# Patient Record
Sex: Female | Born: 1975 | Race: Black or African American | Hispanic: No | Marital: Single | Smoking: Never smoker
Health system: Southern US, Community
[De-identification: ages and names within clinical notes are randomized; demographics above are authoritative.]

## PROBLEM LIST (undated history)

## (undated) DIAGNOSIS — I10 Essential (primary) hypertension: Secondary | ICD-10-CM

## (undated) DIAGNOSIS — R51 Headache: Secondary | ICD-10-CM

## (undated) DIAGNOSIS — T7840XA Allergy, unspecified, initial encounter: Secondary | ICD-10-CM

## (undated) DIAGNOSIS — L309 Dermatitis, unspecified: Secondary | ICD-10-CM

## (undated) DIAGNOSIS — F419 Anxiety disorder, unspecified: Secondary | ICD-10-CM

## (undated) DIAGNOSIS — H539 Unspecified visual disturbance: Secondary | ICD-10-CM

## (undated) DIAGNOSIS — G8929 Other chronic pain: Secondary | ICD-10-CM

## (undated) DIAGNOSIS — D649 Anemia, unspecified: Secondary | ICD-10-CM

## (undated) DIAGNOSIS — M549 Dorsalgia, unspecified: Secondary | ICD-10-CM

## (undated) HISTORY — DX: Essential (primary) hypertension: I10

## (undated) HISTORY — DX: Anxiety disorder, unspecified: F41.9

## (undated) HISTORY — DX: Dorsalgia, unspecified: M54.9

## (undated) HISTORY — DX: Other chronic pain: G89.29

## (undated) HISTORY — DX: Dermatitis, unspecified: L30.9

## (undated) HISTORY — DX: Headache: R51

## (undated) HISTORY — DX: Unspecified visual disturbance: H53.9

## (undated) HISTORY — DX: Allergy, unspecified, initial encounter: T78.40XA

## (undated) HISTORY — DX: Anemia, unspecified: D64.9

---

## 2011-04-06 HISTORY — PX: CERVICAL POLYPECTOMY: SHX88

## 2011-04-06 HISTORY — PX: DILATION AND CURETTAGE OF UTERUS: SHX78

## 2011-12-31 ENCOUNTER — Ambulatory Visit (INDEPENDENT_AMBULATORY_CARE_PROVIDER_SITE_OTHER): Payer: BC Managed Care – PPO | Admitting: Family Medicine

## 2011-12-31 VITALS — BP 130/85 | HR 83 | Temp 98.4°F | Resp 18 | Ht 69.0 in | Wt 235.0 lb

## 2011-12-31 DIAGNOSIS — F419 Anxiety disorder, unspecified: Secondary | ICD-10-CM

## 2011-12-31 DIAGNOSIS — R109 Unspecified abdominal pain: Secondary | ICD-10-CM

## 2011-12-31 DIAGNOSIS — M7650 Patellar tendinitis, unspecified knee: Secondary | ICD-10-CM

## 2011-12-31 DIAGNOSIS — K5900 Constipation, unspecified: Secondary | ICD-10-CM

## 2011-12-31 DIAGNOSIS — L980 Pyogenic granuloma: Secondary | ICD-10-CM

## 2011-12-31 DIAGNOSIS — L02419 Cutaneous abscess of limb, unspecified: Secondary | ICD-10-CM

## 2011-12-31 DIAGNOSIS — IMO0002 Reserved for concepts with insufficient information to code with codable children: Secondary | ICD-10-CM

## 2011-12-31 DIAGNOSIS — F411 Generalized anxiety disorder: Secondary | ICD-10-CM

## 2011-12-31 DIAGNOSIS — L309 Dermatitis, unspecified: Secondary | ICD-10-CM

## 2011-12-31 DIAGNOSIS — L259 Unspecified contact dermatitis, unspecified cause: Secondary | ICD-10-CM

## 2011-12-31 LAB — POCT URINALYSIS DIPSTICK
Glucose, UA: NEGATIVE
Leukocytes, UA: NEGATIVE
Nitrite, UA: NEGATIVE
Protein, UA: NEGATIVE
Spec Grav, UA: 1.025
Urobilinogen, UA: 0.2

## 2011-12-31 LAB — POCT UA - MICROSCOPIC ONLY
Casts, Ur, LPF, POC: NEGATIVE
Crystals, Ur, HPF, POC: NEGATIVE

## 2011-12-31 MED ORDER — CLOBETASOL PROPIONATE 0.05 % EX OINT: TOPICAL_OINTMENT | Freq: Two times a day (BID) | CUTANEOUS | Status: DC

## 2011-12-31 MED ORDER — DOXYCYCLINE HYCLATE 100 MG PO TABS: 100.0000 mg | ORAL_TABLET | Freq: Two times a day (BID) | ORAL | Status: DC

## 2011-12-31 NOTE — Patient Instructions (Addendum)
Take miralax daily for bowels When loose switch to taking metamucil daily Drink lots of fluids. Fruits and vegetables  Clobetasol as needed   Doxycycline twice daily for the infection

## 2011-12-31 NOTE — Progress Notes (Signed)
Subjective: Rolling bands of epigastric discomfort and nausea.  No food intolerances.  Past few weeks especially. Heartburn med did not help in the past.Some constipation.  Hx boils under arm.  Ruptured out now.  Sharp pains in arm on Tuesday.  Stressed.    Burning sensation left knee.    Eczema.  Wants more clobetasol.  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  Chest clear.  Heart rrr.  Abd.  bs normal.  Soft.  Left mild tenderness and fullness. Knee normal.  Patellar tendon area pains.  Pyogenic granuloma over old abscess of left axilla Spent 45 min examining and talking (pt refused blood drawing and staff spent an additional 15 min with her)   Assessment Axillary abscess and pyogenic granuloma Abdominal pain Eczema Knee pain, patellar tendon Abdominal pain Anxiety   Results for orders placed in visit on 12/31/11  POCT UA - MICROSCOPIC ONLY      Component Value Range   WBC, Ur, HPF, POC 0-3     RBC, urine, microscopic tntc     Bacteria, U Microscopic trace     Mucus, UA pos     Epithelial cells, urine per micros 1-2     Crystals, Ur, HPF, POC neg     Casts, Ur, LPF, POC neg     Yeast, UA neg    POCT URINALYSIS DIPSTICK      Component Value Range   Color, UA yellow     Clarity, UA clear     Glucose, UA neg     Bilirubin, UA neg     Ketones, UA neg     Spec Grav, UA 1.025     Blood, UA large     pH, UA 5.5     Protein, UA neg     Urobilinogen, UA 0.2     Nitrite, UA neg     Leukocytes, UA Negative     Patient refused labs  See discharge instructions  Return 6 weeks

## 2012-01-04 LAB — WOUND CULTURE
Gram Stain: NONE SEEN
Gram Stain: NONE SEEN

## 2012-01-21 ENCOUNTER — Ambulatory Visit (INDEPENDENT_AMBULATORY_CARE_PROVIDER_SITE_OTHER): Payer: BC Managed Care – PPO | Admitting: Family Medicine

## 2012-01-21 VITALS — BP 124/89 | HR 88 | Temp 98.7°F | Resp 18 | Wt 235.0 lb

## 2012-01-21 DIAGNOSIS — R110 Nausea: Secondary | ICD-10-CM

## 2012-01-21 DIAGNOSIS — M25519 Pain in unspecified shoulder: Secondary | ICD-10-CM

## 2012-01-21 DIAGNOSIS — R1012 Left upper quadrant pain: Secondary | ICD-10-CM

## 2012-01-21 DIAGNOSIS — IMO0002 Reserved for concepts with insufficient information to code with codable children: Secondary | ICD-10-CM

## 2012-01-21 DIAGNOSIS — M25512 Pain in left shoulder: Secondary | ICD-10-CM

## 2012-01-21 DIAGNOSIS — S46912A Strain of unspecified muscle, fascia and tendon at shoulder and upper arm level, left arm, initial encounter: Secondary | ICD-10-CM

## 2012-01-21 MED ORDER — MELOXICAM 15 MG PO TABS: 15.0000 mg | ORAL_TABLET | Freq: Every day | ORAL | Status: DC

## 2012-01-21 MED ORDER — LANSOPRAZOLE 30 MG PO CPDR: 30.0000 mg | DELAYED_RELEASE_CAPSULE | Freq: Every day | ORAL | Status: DC

## 2012-01-21 NOTE — Progress Notes (Signed)
[address]   [place], [place]  [address]   [phone]  Subjective:    Patient ID: Amanda Haley, female    DOB: [birth year], 36 y.o.   MRN: [id number]  HPIThis 36 y.o. female presents for evaluation of shoulder pain L.  Onset one week ago.  Starts in L shoulder and radiates into L arm to elbow.  No pain in R arm.  A lot of issues with L side of body.  No excessive lifting; help mom set up yard sale two weeks ago; +associated neck pain.  S/p 2 EKGs in past; presented to Urgent Care in past; diagnosed with costochondritis; had lifted something heavy.  No chest pain with this shoulder pain. Worried about MS.    2. Gets burning across abdomen; Dr. Alwyn Ren prescribed Miralax and fiber without much improvement; hears stomach rumbling a lot.  Gets waves of nausea.  Not exercising; not eating right.  Intermittent burning.  Rash develps along L side of abdomen.  Feels warmer on L side.  Bumps come up when feels burning.  NO heartburn; no indigestion.  [place] fries help for a while.  Eating helps with nausea sometimes.  Four years ago, started going to doctors.  History of severe anemia; in June 2013, had surgery for gynecological for uterine polyps.  At last visit, normal bowel movements daily to once weekly.  Stools vary.  No consistent pattern.  Has been taking Miralax without much improvement.  Was having burning in abdomen daily; now having less frequent.  No hematuria; no dysuria. Regular menses; not pregnant.  LMP 2 weeks ago.  3.  Low back pain: s/p PT five years ago; dull pain at different areas of back.  No radiation into legs.  No leg weakness.  No n/t/w.  No saddle paresthesias.  No b/b dysfunction.  4.  L knee pain: intermittent pain.  No associated trauma or overuse.  No swelling.  No giving out.  No popping.     Review of Systems  Constitutional: Negative for fever, chills, diaphoresis and fatigue.  Gastrointestinal: Positive for nausea, abdominal pain and constipation. Negative for vomiting,  diarrhea, blood in stool, abdominal distention, anal bleeding and rectal pain.  Genitourinary: Negative for dysuria, urgency, frequency, hematuria, flank pain, vaginal bleeding, vaginal discharge and vaginal pain.  Musculoskeletal: Positive for back pain and arthralgias. Negative for joint swelling and gait problem.  Skin: Positive for rash.  Neurological: Negative for weakness and numbness.    No past medical history on file.  No past surgical history on file.  Prior to Admission medications   Medication Sig Start Date End Date Taking? Authorizing Provider  clobetasol ointment (TEMOVATE) 0.05 % Apply topically 2 (two) times daily. 12/31/11  Yes Peyton Najjar, MD  doxycycline (VIBRA-TABS) 100 MG tablet Take 1 tablet (100 mg total) by mouth 2 (two) times daily. 12/31/11   Peyton Najjar, MD  lansoprazole (PREVACID) 30 MG capsule Take 1 capsule (30 mg total) by mouth daily. 01/21/12   Ethelda Chick, MD  meloxicam (MOBIC) 15 MG tablet Take 1 tablet (15 mg total) by mouth daily. 01/21/12   Ethelda Chick, MD    No Known Allergies  History   Social History  . Marital Status: Single    Spouse Name: N/A    Number of Children: N/A  . Years of Education: N/A   Occupational History  . Not on file.   Social History Main Topics  . Smoking status: Never Smoker   .  Smokeless tobacco: Not on file  . Alcohol Use: Not on file  . Drug Use: Not on file  . Sexually Active: Not on file   Other Topics Concern  . Not on file   Social History Narrative  . No narrative on file    No family history on file.     Objective:   Physical Exam  Nursing note and vitals reviewed. Constitutional: She is oriented to person, place, and time. She appears well-developed and well-nourished. No distress.  HENT:  Mouth/Throat: Oropharynx is clear and moist.  Eyes: Conjunctivae normal and EOM are normal. Pupils are equal, round, and reactive to light.  Neck: Normal range of motion. Neck supple. No  thyromegaly present.  Cardiovascular: Normal rate, regular rhythm and normal heart sounds.   Pulmonary/Chest: Effort normal and breath sounds normal.  Abdominal: Soft. Bowel sounds are normal. She exhibits no distension and no mass. There is tenderness in the left upper quadrant. There is no rebound and no guarding.  Musculoskeletal:       Left shoulder: She exhibits normal range of motion, no tenderness, no bony tenderness, no spasm and normal strength.       Left elbow: She exhibits normal range of motion and no swelling. no tenderness found. No radial head, no medial epicondyle, no lateral epicondyle and no olecranon process tenderness noted.       Left knee: She exhibits normal range of motion, no swelling, no effusion, no ecchymosis, no deformity, no erythema and no bony tenderness. No medial joint line, no lateral joint line and no patellar tendon tenderness noted.       Cervical back: She exhibits normal range of motion, no tenderness and no bony tenderness.       Lumbar back: She exhibits normal range of motion, no tenderness, no bony tenderness, no pain, no spasm and normal pulse.  Lymphadenopathy:    She has no cervical adenopathy.  Neurological: She is alert and oriented to person, place, and time.  Skin: No rash noted. She is not diaphoretic.  Psychiatric: She has a normal mood and affect. Her behavior is normal. Judgment and thought content normal.       Assessment & Plan:   1. Shoulder pain, left    2. Strain of shoulder, left  meloxicam (MOBIC) 15 MG tablet  3. Abdominal pain, LUQ  lansoprazole (PREVACID) 30 MG capsule  4. Nausea  lansoprazole (PREVACID) 30 MG capsule     1. L Shoulder Pain/Strain:  New.  Recommend rest, ice, NSAIDs.  Rx for Mobic 15mg  daily provided.  Home exercises reviewed and advised to perform daily for next 2-4 weeks. If no improvement in 2-4 weeks, to call for ortho referral.  Avoid repetitive use of L shoulder for next 2-3 weeks.  If no improvement  in pain, will warrant xray. 2.  Abdominal pain LUQ:  New.  Ddx includes GERD, gastritis, constipation.  Associated constipation and nausea. Continue Miralax daily.  Add Prevacid 30mg  daily.  If no improvement in 1 month, RTC for further evaluation. 3.  Nausea:  New.  Associated with LUQ pain, constipation.  No associated vomiting or diarrhea.  Treat with Prevacid, BRAT diet.  If no improvement in one month, RTC.

## 2012-01-21 NOTE — Patient Instructions (Addendum)
1. Shoulder pain, left    2. Strain of shoulder, left  meloxicam (MOBIC) 15 MG tablet  3. Abdominal pain, LUQ  lansoprazole (PREVACID) 30 MG capsule  4. Nausea  lansoprazole (PREVACID) 30 MG capsule   Impingement Syndrome, Rotator Cuff, Bursitis with Rehab Impingement syndrome is a condition that involves inflammation of the tendons of the rotator cuff and the subacromial bursa, that causes pain in the shoulder. The rotator cuff consists of four tendons and muscles that control much of the shoulder and upper arm function. The subacromial bursa is a fluid filled sac that helps reduce friction between the rotator cuff and one of the bones of the shoulder (acromion). Impingement syndrome is usually an overuse injury that causes swelling of the bursa (bursitis), swelling of the tendon (tendonitis), and/or a tear of the tendon (strain). Strains are classified into three categories. Grade 1 strains cause pain, but the tendon is not lengthened. Grade 2 strains include a lengthened ligament, due to the ligament being stretched or partially ruptured. With grade 2 strains there is still function, although the function may be decreased. Grade 3 strains include a complete tear of the tendon or muscle, and function is usually impaired. SYMPTOMS   Pain around the shoulder, often at the outer portion of the upper arm.  Pain that gets worse with shoulder function, especially when reaching overhead or lifting.  Sometimes, aching when not using the arm.  Pain that wakes you up at night.  Sometimes, tenderness, swelling, warmth, or redness over the affected area.  Loss of strength.  Limited motion of the shoulder, especially reaching behind the back (to the back pocket or to unhook bra) or across your body.  Crackling sound (crepitation) when moving the arm.  Biceps tendon pain and inflammation (in the front of the shoulder). Worse when bending the elbow or lifting. CAUSES  Impingement syndrome is often an  overuse injury, in which chronic (repetitive) motions cause the tendons or bursa to become inflamed. A strain occurs when a force is paced on the tendon or muscle that is greater than it can withstand. Common mechanisms of injury include: Stress from sudden increase in duration, frequency, or intensity of training.  Direct hit (trauma) to the shoulder.  Aging, erosion of the tendon with normal use.  Bony bump on shoulder (acromial spur). RISK INCREASES WITH:  Contact sports (football, wrestling, boxing).  Throwing sports (baseball, tennis, volleyball).  Weightlifting and bodybuilding.  Heavy labor.  Previous injury to the rotator cuff, including impingement.  Poor shoulder strength and flexibility.  Failure to warm up properly before activity.  Inadequate protective equipment.  Old age.  Bony bump on shoulder (acromial spur). PREVENTION   Warm up and stretch properly before activity.  Allow for adequate recovery between workouts.  Maintain physical fitness:  Strength, flexibility, and endurance.  Cardiovascular fitness.  Learn and use proper exercise technique. PROGNOSIS  If treated properly, impingement syndrome usually goes away within 6 weeks. Sometimes surgery is required.  RELATED COMPLICATIONS   Longer healing time if not properly treated, or if not given enough time to heal.  Recurring symptoms, that result in a chronic condition.  Shoulder stiffness, frozen shoulder, or loss of motion.  Rotator cuff tendon tear.  Recurring symptoms, especially if activity is resumed too soon, with overuse, with a direct blow, or when using poor technique. TREATMENT  Treatment first involves the use of ice and medicine, to reduce pain and inflammation. The use of strengthening and stretching exercises may help  reduce pain with activity. These exercises may be performed at home or with a therapist. If non-surgical treatment is unsuccessful after more than 6 months,  surgery may be advised. After surgery and rehabilitation, activity is usually possible in 3 months.  MEDICATION  If pain medicine is needed, nonsteroidal anti-inflammatory medicines (aspirin and ibuprofen), or other minor pain relievers (acetaminophen), are often advised.  Do not take pain medicine for 7 days before surgery.  Prescription pain relievers may be given, if your caregiver thinks they are needed. Use only as directed and only as much as you need.  Corticosteroid injections may be given by your caregiver. These injections should be reserved for the most serious cases, because they may only be given a certain number of times. HEAT AND COLD  Cold treatment (icing) should be applied for 10 to 15 minutes every 2 to 3 hours for inflammation and pain, and immediately after activity that aggravates your symptoms. Use ice packs or an ice massage.  Heat treatment may be used before performing stretching and strengthening activities prescribed by your caregiver, physical therapist, or athletic trainer. Use a heat pack or a warm water soak. SEEK MEDICAL CARE IF:   Symptoms get worse or do not improve in 4 to 6 weeks, despite treatment.  New, unexplained symptoms develop. (Drugs used in treatment may produce side effects.) EXERCISES  RANGE OF MOTION (ROM) AND STRETCHING EXERCISES - Impingement Syndrome (Rotator Cuff  Tendinitis, Bursitis) These exercises may help you when beginning to rehabilitate your injury. Your symptoms may go away with or without further involvement from your physician, physical therapist or athletic trainer. While completing these exercises, remember:   Restoring tissue flexibility helps normal motion to return to the joints. This allows healthier, less painful movement and activity.  An effective stretch should be held for at least 30 seconds.  A stretch should never be painful. You should only feel a gentle lengthening or release in the stretched tissue. STRETCH   Flexion, Standing  Stand with good posture. With an underhand grip on your right / left hand, and an overhand grip on the opposite hand, grasp a broomstick or cane so that your hands are a little more than shoulder width apart.  Keeping your right / left elbow straight and shoulder muscles relaxed, push the stick with your opposite hand, to raise your right / left arm in front of your body and then overhead. Raise your arm until you feel a stretch in your right / left shoulder, but before you have increased shoulder pain.  Try to avoid shrugging your right / left shoulder as your arm rises, by keeping your shoulder blade tucked down and toward your mid-back spine. Hold for __________ seconds.  Slowly return to the starting position. Repeat __________ times. Complete this exercise __________ times per day. STRETCH  Abduction, Supine  Lie on your back. With an underhand grip on your right / left hand and an overhand grip on the opposite hand, grasp a broomstick or cane so that your hands are a little more than shoulder width apart.  Keeping your right / left elbow straight and your shoulder muscles relaxed, push the stick with your opposite hand, to raise your right / left arm out to the side of your body and then overhead. Raise your arm until you feel a stretch in your right / left shoulder, but before you have increased shoulder pain.  Try to avoid shrugging your right / left shoulder as your arm rises, by  keeping your shoulder blade tucked down and toward your mid-back spine. Hold for __________ seconds.  Slowly return to the starting position. Repeat __________ times. Complete this exercise __________ times per day. ROM  Flexion, Active-Assisted  Lie on your back. You may bend your knees for comfort.  Grasp a broomstick or cane so your hands are about shoulder width apart. Your right / left hand should grip the end of the stick, so that your hand is positioned "thumbs-up," as if you were  about to shake hands.  Using your healthy arm to lead, raise your right / left arm overhead, until you feel a gentle stretch in your shoulder. Hold for __________ seconds.  Use the stick to assist in returning your right / left arm to its starting position. Repeat __________ times. Complete this exercise __________ times per day.  ROM - Internal Rotation, Supine   Lie on your back on a firm surface. Place your right / left elbow about 60 degrees away from your side. Elevate your elbow with a folded towel, so that the elbow and shoulder are the same height.  Using a broomstick or cane and your strong arm, pull your right / left hand toward your body until you feel a gentle stretch, but no increase in your shoulder pain. Keep your shoulder and elbow in place throughout the exercise.  Hold for __________ seconds. Slowly return to the starting position. Repeat __________ times. Complete this exercise __________ times per day. STRETCH - Internal Rotation  Place your right / left hand behind your back, palm up.  Throw a towel or belt over your opposite shoulder. Grasp the towel with your right / left hand.  While keeping an upright posture, gently pull up on the towel, until you feel a stretch in the front of your right / left shoulder.  Avoid shrugging your right / left shoulder as your arm rises, by keeping your shoulder blade tucked down and toward your mid-back spine.  Hold for __________ seconds. Release the stretch, by lowering your healthy hand. Repeat __________ times. Complete this exercise __________ times per day. ROM - Internal Rotation   Using an underhand grip, grasp a stick behind your back with both hands.  While standing upright with good posture, slide the stick up your back until you feel a mild stretch in the front of your shoulder.  Hold for __________ seconds. Slowly return to your starting position. Repeat __________ times. Complete this exercise __________ times per  day.  STRETCH  Posterior Shoulder Capsule   Stand or sit with good posture. Grasp your right / left elbow and draw it across your chest, keeping it at the same height as your shoulder.  Pull your elbow, so your upper arm comes in closer to your chest. Pull until you feel a gentle stretch in the back of your shoulder.  Hold for __________ seconds. Repeat __________ times. Complete this exercise __________ times per day. STRENGTHENING EXERCISES - Impingement Syndrome (Rotator Cuff Tendinitis, Bursitis) These exercises may help you when beginning to rehabilitate your injury. They may resolve your symptoms with or without further involvement from your physician, physical therapist or athletic trainer. While completing these exercises, remember:  Muscles can gain both the endurance and the strength needed for everyday activities through controlled exercises.  Complete these exercises as instructed by your physician, physical therapist or athletic trainer. Increase the resistance and repetitions only as guided.  You may experience muscle soreness or fatigue, but the pain or discomfort you  are trying to eliminate should never worsen during these exercises. If this pain does get worse, stop and make sure you are following the directions exactly. If the pain is still present after adjustments, discontinue the exercise until you can discuss the trouble with your clinician.  During your recovery, avoid activity or exercises which involve actions that place your injured hand or elbow above your head or behind your back or head. These positions stress the tissues which you are trying to heal. STRENGTH - Scapular Depression and Adduction   With good posture, sit on a firm chair. Support your arms in front of you, with pillows, arm rests, or on a table top. Have your elbows in line with the sides of your body.  Gently draw your shoulder blades down and toward your mid-back spine. Gradually increase the  tension, without tensing the muscles along the top of your shoulders and the back of your neck.  Hold for __________ seconds. Slowly release the tension and relax your muscles completely before starting the next repetition.  After you have practiced this exercise, remove the arm support and complete the exercise in standing as well as sitting position. Repeat __________ times. Complete this exercise __________ times per day.  STRENGTH - Shoulder Abductors, Isometric  With good posture, stand or sit about 4-6 inches from a wall, with your right / left side facing the wall.  Bend your right / left elbow. Gently press your right / left elbow into the wall. Increase the pressure gradually, until you are pressing as hard as you can, without shrugging your shoulder or increasing any shoulder discomfort.  Hold for __________ seconds.  Release the tension slowly. Relax your shoulder muscles completely before you begin the next repetition. Repeat __________ times. Complete this exercise __________ times per day.  STRENGTH - External Rotators, Isometric  Keep your right / left elbow at your side and bend it 90 degrees.  Step into a door frame so that the outside of your right / left wrist can press against the door frame without your upper arm leaving your side.  Gently press your right / left wrist into the door frame, as if you were trying to swing the back of your hand away from your stomach. Gradually increase the tension, until you are pressing as hard as you can, without shrugging your shoulder or increasing any shoulder discomfort.  Hold for __________ seconds.  Release the tension slowly. Relax your shoulder muscles completely before you begin the next repetition. Repeat __________ times. Complete this exercise __________ times per day.  STRENGTH - Supraspinatus   Stand or sit with good posture. Grasp a __________ weight, or an exercise band or tubing, so that your hand is "thumbs-up,"  like you are shaking hands.  Slowly lift your right / left arm in a "V" away from your thigh, diagonally into the space between your side and straight ahead. Lift your hand to shoulder height or as far as you can, without increasing any shoulder pain. At first, many people do not lift their hands above shoulder height.  Avoid shrugging your right / left shoulder as your arm rises, by keeping your shoulder blade tucked down and toward your mid-back spine.  Hold for __________ seconds. Control the descent of your hand, as you slowly return to your starting position. Repeat __________ times. Complete this exercise __________ times per day.  STRENGTH - External Rotators  Secure a rubber exercise band or tubing to a fixed object (table, pole)  so that it is at the same height as your right / left elbow when you are standing or sitting on a firm surface.  Stand or sit so that the secured exercise band is at your uninjured side.  Bend your right / left elbow 90 degrees. Place a folded towel or small pillow under your right / left arm, so that your elbow is a few inches away from your side.  Keeping the tension on the exercise band, pull it away from your body, as if pivoting on your elbow. Be sure to keep your body steady, so that the movement is coming only from your rotating shoulder.  Hold for __________ seconds. Release the tension in a controlled manner, as you return to the starting position. Repeat __________ times. Complete this exercise __________ times per day.  STRENGTH - Internal Rotators   Secure a rubber exercise band or tubing to a fixed object (table, pole) so that it is at the same height as your right / left elbow when you are standing or sitting on a firm surface.  Stand or sit so that the secured exercise band is at your right / left side.  Bend your elbow 90 degrees. Place a folded towel or small pillow under your right / left arm so that your elbow is a few inches away from  your side.  Keeping the tension on the exercise band, pull it across your body, toward your stomach. Be sure to keep your body steady, so that the movement is coming only from your rotating shoulder.  Hold for __________ seconds. Release the tension in a controlled manner, as you return to the starting position. Repeat __________ times. Complete this exercise __________ times per day.  STRENGTH - Scapular Protractors, Standing   Stand arms length away from a wall. Place your hands on the wall, keeping your elbows straight.  Begin by dropping your shoulder blades down and toward your mid-back spine.  To strengthen your protractors, keep your shoulder blades down, but slide them forward on your rib cage. It will feel as if you are lifting the back of your rib cage away from the wall. This is a subtle motion and can be challenging to complete. Ask your caregiver for further instruction, if you are not sure you are doing the exercise correctly.  Hold for __________ seconds. Slowly return to the starting position, resting the muscles completely before starting the next repetition. Repeat __________ times. Complete this exercise __________ times per day. STRENGTH - Scapular Protractors, Supine  Lie on your back on a firm surface. Extend your right / left arm straight into the air while holding a __________ weight in your hand.  Keeping your head and back in place, lift your shoulder off the floor.  Hold for __________ seconds. Slowly return to the starting position, and allow your muscles to relax completely before starting the next repetition. Repeat __________ times. Complete this exercise __________ times per day. STRENGTH - Scapular Protractors, Quadruped  Get onto your hands and knees, with your shoulders directly over your hands (or as close as you can be, comfortably).  Keeping your elbows locked, lift the back of your rib cage up into your shoulder blades, so your mid-back rounds out.  Keep your neck muscles relaxed.  Hold this position for __________ seconds. Slowly return to the starting position and allow your muscles to relax completely before starting the next repetition. Repeat __________ times. Complete this exercise __________ times per day.  STRENGTH -  Scapular Retractors  Secure a rubber exercise band or tubing to a fixed object (table, pole), so that it is at the height of your shoulders when you are either standing, or sitting on a firm armless chair.  With a palm down grip, grasp an end of the band in each hand. Straighten your elbows and lift your hands straight in front of you, at shoulder height. Step back, away from the secured end of the band, until it becomes tense.  Squeezing your shoulder blades together, draw your elbows back toward your sides, as you bend them. Keep your upper arms lifted away from your body throughout the exercise.  Hold for __________ seconds. Slowly ease the tension on the band, as you reverse the directions and return to the starting position. Repeat __________ times. Complete this exercise __________ times per day. STRENGTH - Shoulder Extensors   Secure a rubber exercise band or tubing to a fixed object (table, pole) so that it is at the height of your shoulders when you are either standing, or sitting on a firm armless chair.  With a thumbs-up grip, grasp an end of the band in each hand. Straighten your elbows and lift your hands straight in front of you, at shoulder height. Step back, away from the secured end of the band, until it becomes tense.  Squeezing your shoulder blades together, pull your hands down to the sides of your thighs. Do not allow your hands to go behind you.  Hold for __________ seconds. Slowly ease the tension on the band, as you reverse the directions and return to the starting position. Repeat __________ times. Complete this exercise __________ times per day.  STRENGTH - Scapular Retractors and External  Rotators   Secure a rubber exercise band or tubing to a fixed object (table, pole) so that it is at the height as your shoulders, when you are either standing, or sitting on a firm armless chair.  With a palm down grip, grasp an end of the band in each hand. Bend your elbows 90 degrees and lift your elbows to shoulder height, at your sides. Step back, away from the secured end of the band, until it becomes tense.  Squeezing your shoulder blades together, rotate your shoulders so that your upper arms and elbows remain stationary, but your fists travel upward to head height.  Hold for __________ seconds. Slowly ease the tension on the band, as you reverse the directions and return to the starting position. Repeat __________ times. Complete this exercise __________ times per day.  STRENGTH - Scapular Retractors and External Rotators, Rowing   Secure a rubber exercise band or tubing to a fixed object (table, pole) so that it is at the height of your shoulders, when you are either standing, or sitting on a firm armless chair.  With a palm down grip, grasp an end of the band in each hand. Straighten your elbows and lift your hands straight in front of you, at shoulder height. Step back, away from the secured end of the band, until it becomes tense.  Step 1: Squeeze your shoulder blades together. Bending your elbows, draw your hands to your chest, as if you are rowing a boat. At the end of this motion, your hands and elbow should be at shoulder height and your elbows should be out to your sides.  Step 2: Rotate your shoulders, to raise your hands above your head. Your forearms should be vertical and your upper arms should be horizontal.  Hold  for __________ seconds. Slowly ease the tension on the band, as you reverse the directions and return to the starting position. Repeat __________ times. Complete this exercise __________ times per day.  STRENGTH  Scapular Depressors  Find a sturdy chair without  wheels, such as a dining room chair.  Keeping your feet on the floor, and your hands on the chair arms, lift your bottom up from the seat, and lock your elbows.  Keeping your elbows straight, allow gravity to pull your body weight down. Your shoulders will rise toward your ears.  Raise your body against gravity by drawing your shoulder blades down your back, shortening the distance between your shoulders and ears. Although your feet should always maintain contact with the floor, your feet should progressively support less body weight, as you get stronger.  Hold for __________ seconds. In a controlled and slow manner, lower your body weight to begin the next repetition. Repeat __________ times. Complete this exercise __________ times per day.  Document Released: 03/22/2005 Document Revised: 06/14/2011 Document Reviewed: 07/04/2008 [facility] Patient Information 2013 [place], [place].

## 2012-03-11 NOTE — Progress Notes (Signed)
Reviewed and agree.

## 2012-03-24 ENCOUNTER — Ambulatory Visit (INDEPENDENT_AMBULATORY_CARE_PROVIDER_SITE_OTHER): Payer: BC Managed Care – PPO | Admitting: Family Medicine

## 2012-03-24 ENCOUNTER — Encounter: Payer: Self-pay | Admitting: Family Medicine

## 2012-03-24 VITALS — BP 145/94 | HR 82 | Temp 97.6°F | Resp 16 | Ht 69.0 in | Wt 232.0 lb

## 2012-03-24 DIAGNOSIS — R197 Diarrhea, unspecified: Secondary | ICD-10-CM

## 2012-03-24 DIAGNOSIS — M7650 Patellar tendinitis, unspecified knee: Secondary | ICD-10-CM

## 2012-03-24 DIAGNOSIS — R1012 Left upper quadrant pain: Secondary | ICD-10-CM

## 2012-03-24 DIAGNOSIS — L853 Xerosis cutis: Secondary | ICD-10-CM

## 2012-03-24 LAB — POCT URINALYSIS DIPSTICK
Bilirubin, UA: NEGATIVE
Blood, UA: NEGATIVE
Glucose, UA: NEGATIVE
Ketones, UA: NEGATIVE
Nitrite, UA: NEGATIVE
Spec Grav, UA: 1.02
Urobilinogen, UA: 0.2
pH, UA: 7.5

## 2012-03-24 LAB — POCT CBC
Granulocyte percent: 57.3 %G (ref 37–80)
HCT, POC: 34.8 % — AB (ref 37.7–47.9)
Hemoglobin: 9.9 g/dL — AB (ref 12.2–16.2)
Lymph, poc: 3.1 (ref 0.6–3.4)
MCH, POC: 21.5 pg — AB (ref 27–31.2)
MCHC: 28.4 g/dL — AB (ref 31.8–35.4)
MCV: 75.6 fL — AB (ref 80–97)
MID (cbc): 0.5 (ref 0–0.9)
MPV: 7.6 fL (ref 0–99.8)
POC Granulocyte: 4.8 (ref 2–6.9)
POC LYMPH PERCENT: 36.9 %L (ref 10–50)
POC MID %: 5.8 %M (ref 0–12)
Platelet Count, POC: 461 10*3/uL — AB (ref 142–424)
RBC: 4.6 M/uL (ref 4.04–5.48)
RDW, POC: 18.2 %
WBC: 8.4 10*3/uL (ref 4.6–10.2)

## 2012-03-24 LAB — POCT UA - MICROSCOPIC ONLY
Casts, Ur, LPF, POC: NEGATIVE
Crystals, Ur, HPF, POC: NEGATIVE
Mucus, UA: POSITIVE
Yeast, UA: NEGATIVE

## 2012-03-24 MED ORDER — CLOBETASOL PROPIONATE 0.05 % EX OINT: TOPICAL_OINTMENT | Freq: Two times a day (BID) | CUTANEOUS | Status: DC

## 2012-03-24 NOTE — Progress Notes (Signed)
36 yo woman with over a year of left flank burning pain associated with nausea.  The pain has become more frequent in last month, now almost every day.   Unrelated to diet.  Not worse with any activity.  Stress may make the pain more noticeable.  She also has occasional right flank pain.  Other current symptoms:  Bad cold and cough the last week.  Also has irregular bowel movements over the past year (looser stools x 1 year).  Some borborygmi and bloating.  Some lower back pain.  Sig. Negs:  No fever, no BRBPR, no belching  F/Hx:  Grandfather had colon cancer.   Objective:  NAD

## 2012-03-25 LAB — TSH: TSH: 1.337 u[IU]/mL (ref 0.350–4.500)

## 2012-03-26 LAB — HELICOBACTER PYLORI  ANTIBODY, IGM: Helicobacter pylori, IgM: 0.6 U/mL (ref <9.0)

## 2012-03-27 ENCOUNTER — Other Ambulatory Visit: Payer: Self-pay | Admitting: Family Medicine

## 2012-03-27 DIAGNOSIS — E559 Vitamin D deficiency, unspecified: Secondary | ICD-10-CM

## 2012-03-27 LAB — VITAMIN D 25 HYDROXY (VIT D DEFICIENCY, FRACTURES): Vit D, 25-Hydroxy: <10 ng/mL — ABNORMAL LOW (ref 30–89)

## 2012-03-27 MED ORDER — VITAMIN D3 1.25 MG (50000 UT) PO CAPS: 1.0000 | ORAL_CAPSULE | ORAL | Status: DC

## 2012-03-30 ENCOUNTER — Telehealth: Payer: Self-pay

## 2012-03-30 NOTE — Telephone Encounter (Signed)
Message copied by Johnnette Litter on Thu Mar 30, 2012  1:01 PM ------      Message from: Elvina Sidle      Created: Wed Mar 29, 2012  4:58 PM       Please tell patient that she does have anemia and that we want to recheck the hemoglobin in 1 month      ----- Message -----         From: SYSTEM         Sent: 03/29/2012  12:04 AM           To: Elvina Sidle, MD

## 2012-03-30 NOTE — Telephone Encounter (Signed)
LMOM to CB. 

## 2012-03-31 NOTE — Telephone Encounter (Signed)
LMOM for pt to CB concerning labs. Make sure pt is taking 325 mg of OTC iron QD, and also make sure that she is aware of the Vit D Rx sent in for her on 03/27/12.

## 2012-04-05 NOTE — Telephone Encounter (Signed)
Left another message for her

## 2012-04-06 NOTE — Telephone Encounter (Signed)
Pt called back and I went over lab results and need to take new Vit D Rx and OTC iron. Also advised pt to recheck in 3 mos. Pt agreed and mailed her a copy of labs.

## 2012-07-26 ENCOUNTER — Ambulatory Visit (INDEPENDENT_AMBULATORY_CARE_PROVIDER_SITE_OTHER): Payer: BC Managed Care – PPO | Admitting: Family Medicine

## 2012-07-26 VITALS — BP 130/84 | HR 92 | Temp 98.8°F | Resp 18 | Ht 70.0 in | Wt 237.0 lb

## 2012-07-26 DIAGNOSIS — F439 Reaction to severe stress, unspecified: Secondary | ICD-10-CM

## 2012-07-26 DIAGNOSIS — Z733 Stress, not elsewhere classified: Secondary | ICD-10-CM

## 2012-07-26 DIAGNOSIS — M549 Dorsalgia, unspecified: Secondary | ICD-10-CM

## 2012-07-26 LAB — POCT URINALYSIS DIPSTICK
Bilirubin, UA: NEGATIVE
Blood, UA: NEGATIVE
Glucose, UA: NEGATIVE
Ketones, UA: NEGATIVE
Leukocytes, UA: NEGATIVE
Nitrite, UA: NEGATIVE
Protein, UA: 30
Spec Grav, UA: >=1.03
Urobilinogen, UA: 0.2
pH, UA: 5.5

## 2012-07-26 LAB — POCT UA - MICROSCOPIC ONLY
Bacteria, U Microscopic: NEGATIVE
Casts, Ur, LPF, POC: NEGATIVE
Crystals, Ur, HPF, POC: NEGATIVE
Yeast, UA: NEGATIVE

## 2012-07-26 MED ORDER — LORAZEPAM 0.5 MG PO TABS: 0.5000 mg | ORAL_TABLET | Freq: Two times a day (BID) | ORAL | Status: DC | PRN
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
## 2012-08-01 ENCOUNTER — Ambulatory Visit (INDEPENDENT_AMBULATORY_CARE_PROVIDER_SITE_OTHER): Payer: BC Managed Care – PPO | Admitting: Family Medicine

## 2012-08-01 ENCOUNTER — Ambulatory Visit: Payer: BC Managed Care – PPO | Admitting: Family Medicine

## 2012-08-01 ENCOUNTER — Encounter: Payer: Self-pay | Admitting: Family Medicine

## 2012-08-01 VITALS — BP 128/88 | HR 88 | Temp 98.5°F | Resp 16 | Ht 69.0 in | Wt 240.0 lb

## 2012-08-01 DIAGNOSIS — H539 Unspecified visual disturbance: Secondary | ICD-10-CM

## 2012-08-01 DIAGNOSIS — R109 Unspecified abdominal pain: Secondary | ICD-10-CM

## 2012-08-01 DIAGNOSIS — D649 Anemia, unspecified: Secondary | ICD-10-CM

## 2012-08-01 DIAGNOSIS — F430 Acute stress reaction: Secondary | ICD-10-CM

## 2012-08-01 DIAGNOSIS — E559 Vitamin D deficiency, unspecified: Secondary | ICD-10-CM

## 2012-08-01 DIAGNOSIS — G4700 Insomnia, unspecified: Secondary | ICD-10-CM

## 2012-08-01 DIAGNOSIS — R198 Other specified symptoms and signs involving the digestive system and abdomen: Secondary | ICD-10-CM

## 2012-08-01 DIAGNOSIS — R194 Change in bowel habit: Secondary | ICD-10-CM

## 2012-08-01 DIAGNOSIS — G43909 Migraine, unspecified, not intractable, without status migrainosus: Secondary | ICD-10-CM

## 2012-08-01 NOTE — Patient Instructions (Addendum)
1.  EYE DOCTORS: [facility]; DR. Alden Hipp AND [facility] 2.  RECOMMEND PROBIOTIC (PHILLIPS COLON HEALTH) 3.  RECOMMEND VITAMIN D SUPPLEMENTATION DAILY 1000-2000 UNITS. 4.  RECOMMEND FERROUS SULFATE 325MG  ONE DAILY.   5. RETURN IN THREE MONTHS FOR LABS, URINE.
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
## 2012-08-02 ENCOUNTER — Other Ambulatory Visit (INDEPENDENT_AMBULATORY_CARE_PROVIDER_SITE_OTHER): Payer: BC Managed Care – PPO | Admitting: Family Medicine

## 2012-08-02 DIAGNOSIS — R198 Other specified symptoms and signs involving the digestive system and abdomen: Secondary | ICD-10-CM

## 2012-08-02 DIAGNOSIS — R197 Diarrhea, unspecified: Secondary | ICD-10-CM

## 2012-08-02 DIAGNOSIS — R194 Change in bowel habit: Secondary | ICD-10-CM

## 2012-08-02 NOTE — Progress Notes (Signed)
Here today to drop off stool for C-Diff

## 2012-08-09 ENCOUNTER — Telehealth: Payer: Self-pay

## 2012-08-09 DIAGNOSIS — R197 Diarrhea, unspecified: Secondary | ICD-10-CM

## 2012-08-09 LAB — CLOSTRIDIUM DIFFICILE EIA: CDIFTX: NEGATIVE

## 2012-08-09 NOTE — Telephone Encounter (Signed)
Spoke with patient and advised her [facility] called, they had lost her specimen for C-Diff.  Patient will return to clinic for collection container.

## 2012-11-06 ENCOUNTER — Ambulatory Visit: Payer: BC Managed Care – PPO | Admitting: Family Medicine

## 2012-11-20 ENCOUNTER — Encounter: Payer: Self-pay | Admitting: Family Medicine

## 2012-11-20 ENCOUNTER — Other Ambulatory Visit: Payer: Self-pay | Admitting: Family Medicine

## 2012-11-20 ENCOUNTER — Ambulatory Visit (INDEPENDENT_AMBULATORY_CARE_PROVIDER_SITE_OTHER): Payer: BC Managed Care – PPO | Admitting: Family Medicine

## 2012-11-20 VITALS — BP 136/80 | HR 90 | Temp 98.3°F | Resp 16 | Ht 69.0 in | Wt 242.0 lb

## 2012-11-20 DIAGNOSIS — M545 Low back pain: Secondary | ICD-10-CM

## 2012-11-20 DIAGNOSIS — G4700 Insomnia, unspecified: Secondary | ICD-10-CM

## 2012-11-20 DIAGNOSIS — R194 Change in bowel habit: Secondary | ICD-10-CM

## 2012-11-20 DIAGNOSIS — R202 Paresthesia of skin: Secondary | ICD-10-CM

## 2012-11-20 DIAGNOSIS — D649 Anemia, unspecified: Secondary | ICD-10-CM

## 2012-11-20 DIAGNOSIS — R110 Nausea: Secondary | ICD-10-CM

## 2012-11-20 DIAGNOSIS — R5381 Other malaise: Secondary | ICD-10-CM

## 2012-11-20 DIAGNOSIS — N840 Polyp of corpus uteri: Secondary | ICD-10-CM

## 2012-11-20 DIAGNOSIS — R198 Other specified symptoms and signs involving the digestive system and abdomen: Secondary | ICD-10-CM

## 2012-11-20 LAB — CBC WITH DIFFERENTIAL/PLATELET
Basophils Relative: 0 % (ref 0–1)
Eosinophils Absolute: 0.2 10*3/uL (ref 0.0–0.7)
Hemoglobin: 13.1 g/dL (ref 12.0–15.0)
Lymphs Abs: 2.2 10*3/uL (ref 0.7–4.0)
Monocytes Relative: 8 % (ref 3–12)
Neutro Abs: 5.8 10*3/uL (ref 1.7–7.7)
Neutrophils Relative %: 65 % (ref 43–77)
Platelets: 262 10*3/uL (ref 150–400)
RBC: 4.7 MIL/uL (ref 3.87–5.11)
WBC: 8.9 10*3/uL (ref 4.0–10.5)

## 2012-11-20 LAB — COMPREHENSIVE METABOLIC PANEL
ALT: 10 U/L (ref 0–35)
AST: 15 U/L (ref 0–37)
CO2: 24 mEq/L (ref 19–32)
Chloride: 101 mEq/L (ref 96–112)
Creat: 0.89 mg/dL (ref 0.50–1.10)
Sodium: 136 mEq/L (ref 135–145)
Total Bilirubin: 1.2 mg/dL (ref 0.3–1.2)
Total Protein: 7.7 g/dL (ref 6.0–8.3)

## 2012-11-20 LAB — VITAMIN B12: Vitamin B-12: 353 pg/mL (ref 211–911)

## 2012-11-20 LAB — TSH: TSH: 1.77 u[IU]/mL (ref 0.350–4.500)

## 2012-11-20 NOTE — Progress Notes (Signed)
[address]   [place], [place]  [address]   [phone]  Subjective:    Patient ID: Amanda Haley, female    DOB: Jun 14, 1975, 37 y.o.   MRN: [id number]  HPI This 37 y.o. female presents for three month follow-up and evaluation of the following:  1.  Anemia: started iron supplement for two months after last visit;  Taking MVI with iron; also has iron supplement.  S/p surgery last June 2013; uterine/endometrial polypectomy and D&C in [place]; did bleed daily for six months before polypectomy; before polyp issues, period was very regularly.  Now, menses fairly light; bleeds 6-7 days; will pass clots but menses overall light.  2.  Vitamin D deficiency: took Vitamin D supplement after last visit; not as consistent with vitamin D but taking Flinstone vitamin daily.  Continues to suffer with fatigue.  3.  IBS:  Diet is still not that great.  Eating habits not great.  Never started [facility] colon health or other probiotic after last visit.  Has been vacationing for last month.  Some days only eats one meal per day; other days will eat three meals per day.  +nausea; +sensation in throat; no vomiting; stools vary from loose/diarrhea stools to constipation; no bloody or melanotic stools.  +burning sensation on L side of abdomen; very concerned with persistent burning in abdomen.   4. Lower back pain: continues to have burning along spine; skin on back feels warmer to touch.  S/p ortho evaluation in 2011; s/p xrays; referred to PT; participated in PT for several weeks.  Pain radiates into buttocks only; no b/b dysfunction; no saddle paresthesias.  Burning sensation along spine and L lateral aspect of abdomen.    5. Feeling really tired: not sleeping that great.  Goes to bed at 11:00; wakes up 1-2 times per night; may take 30-60 minutes to fall back to sleep; lays in the bed after waking up at 7:00am. Stays in bed until 9:00am  Taking a nap almost daily. Tried Melatonin a few years ago.  "I hate my life";  realizes that insomnia is stress induced.  Plans to contact a therapist this week to undergo counseling.  Hates job but stuck.  No SI.    6.  Vision changes: following trauma to eye; s/p eye exam by Dr. Ward Givens; L eye continues to feel weird; did not undergo dilated exam; no damage to eye; Dr. Ward Givens.  7. Nausea:  LMP ended 11/03/12; started 10/28/12.  Not pregnant.  Tried Omeprazole in past but felt horrible on it.  Gets full sensation in throat; no vomiting; has gained weight since last visit; no vomiting; no abdominal pain.    8. Burning: along abdomen and back/spine.  Very worried about etiology; very concerned about ovarian cancer.  S/p extensive gynecological work up including D&C and endometrial polypectomy last year; s/p pap smear that was normal.  Not sure if underwent pelvic ultrasound.    9. Stress reaction: stressed by work situation; "I hate my life".  +insomnia with frequent nighttime awakening.    Review of Systems  Constitutional: Positive for fatigue. Negative for fever, chills, diaphoresis, activity change and appetite change.  Eyes: Positive for visual disturbance.  Gastrointestinal: Positive for nausea, diarrhea and constipation. Negative for vomiting, abdominal pain, blood in stool, abdominal distention and anal bleeding.  Endocrine: Negative for cold intolerance, heat intolerance, polydipsia, polyphagia and polyuria.  Genitourinary: Negative for hematuria and menstrual problem.  Musculoskeletal: Positive for myalgias and back pain.  Neurological: Negative for dizziness,  weakness, numbness and headaches.  Psychiatric/Behavioral: Positive for sleep disturbance and dysphoric mood. Negative for suicidal ideas and self-injury. The patient is nervous/anxious.     Past Medical History  Diagnosis Date  . Allergy   . Eczema     Past Surgical History  Procedure Laterality Date  . Dilation and curettage of uterus  2013  . Cervical polypectomy  2013    Prior to Admission  medications   Medication Sig Start Date End Date Taking? Authorizing Provider  clobetasol ointment (TEMOVATE) 0.05 % Apply topically 2 (two) times daily. 03/24/12  Yes Elvina Sidle, MD  LORazepam (ATIVAN) 0.5 MG tablet Take 1 tablet (0.5 mg total) by mouth 2 (two) times daily as needed for anxiety. 07/26/12   Elvina Sidle, MD    No Known Allergies  History   Social History  . Marital Status: Single    Spouse Name: N/A    Number of Children: N/A  . Years of Education: N/A   Occupational History  . Not on file.   Social History Main Topics  . Smoking status: Never Smoker   . Smokeless tobacco: Not on file  . Alcohol Use: Not on file  . Drug Use: Not on file  . Sexual Activity: Not on file   Other Topics Concern  . Not on file   Social History Narrative   Marital status: single; not dating.      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      Lives: alone; family in [place] and [place].                Family History  Problem Relation Age of Onset  . Cancer Father     esophageal and throat cancers.       Objective:   Physical Exam  Nursing note and vitals reviewed. Constitutional: She is oriented to person, place, and time. She appears well-developed and well-nourished. No distress.  HENT:  Head: Normocephalic and atraumatic.  Right Ear: External ear normal.  Left Ear: External ear normal.  Nose: Nose normal.  Mouth/Throat: Oropharynx is clear and moist.  Eyes: Conjunctivae and EOM are normal. Pupils are equal, round, and reactive to light.  Neck: Normal range of motion. Neck supple. No thyromegaly present.  Cardiovascular: Normal rate and regular rhythm.  Exam reveals no gallop and no friction rub.   No murmur heard. Pulmonary/Chest: Effort normal and breath sounds normal. She has no wheezes. She has no rales.  Abdominal: Soft. Bowel sounds are normal. She  exhibits no distension and no mass. There is no tenderness. There is no rebound and no guarding.  Musculoskeletal:       Right shoulder: Normal.       Left shoulder: Normal.       Cervical back: Normal.       Lumbar back: Normal. She exhibits normal range of motion, no tenderness, no bony tenderness, no pain and no spasm.  Lymphadenopathy:    She has no cervical adenopathy.  Neurological: She is alert and oriented to person, place, and time. No cranial nerve deficit. She exhibits normal muscle tone. Coordination normal.  Skin: Skin is warm and dry. No rash noted. She is not diaphoretic. No erythema. No pallor.  Psychiatric: She has a normal mood and affect. Her behavior is normal. Judgment and thought content normal.  Assessment & Plan:  Nausea alone - Plan: Ambulatory referral to Gastroenterology  Change in bowel habits - Plan: Ambulatory referral to Gastroenterology  Low back pain - Plan: Ambulatory referral to Orthopedic Surgery  Uterine polyp - Plan: Ambulatory referral to Gynecology  Anemia - Plan: Vitamin B12, Iron, Folate  Other malaise and fatigue - Plan: CBC with Differential, TSH, Comprehensive metabolic panel  Paresthesia - Plan: Vitamin B12  Insomnia   1.  Nausea:  New. Associated with change in bowel habits over the past three years; also related with GERD like symptoms; refer to GI to confirm diagnosis of IBS and to rule out other etiology; recommend trial of Zantac 150mg  bid for next 2-3 weeks. 2.  Change in bowel habits: persistent; non-compliance with trial of probiotic; refer to GI. 3.  Low back pain:  Persistent; with burning sensation along spine with radiation into L abdomen.  S/p PT in 2012 for low back pain; refer to ortho per patient request.   4.  Uterine polyp:  Resolved; patient very concerned regarding persistent gynecological process that may be contributing to current abdominal/GI symptoms; agreeable to refer to gynecology for further  evaluation. 5.  Anemia: Persistent; obtain labs including CBC, iron, B12, folate.  S/p two months of daily iron supplement. 6.  Malaise and fatigue:  New. Secondary to insomnia, stress, anemia.  Obtain labs; patient to contact psychologist this week for consultation.   7. Burning sensation in back and L abdomen:   Persistent; ddx includes radicular symptoms, B12 deficiency; obtain labs.  8.  Insomnia: Recurrent; recommend regular exercise and good sleep hygiene.  Recommend trial of Melatonin. 9.  Stress reaction: persistent; patient more aware of current stressors that are likely etiology to various complaints/ issues.  Plans to start psychotherapy.  Recommend follow-up in 3-4 months to follow-up on various issues.

## 2012-11-20 NOTE — Patient Instructions (Addendum)
1. Recommend Zantac/Ranitidine 150mg  one tablet twice daily for nausea, reflux.   Gastroesophageal Reflux Disease, Adult Gastroesophageal reflux disease (GERD) happens when acid from your stomach flows up into the esophagus. When acid comes in contact with the esophagus, the acid causes soreness (inflammation) in the esophagus. Over time, GERD may create small holes (ulcers) in the lining of the esophagus. CAUSES   Increased body weight. This puts pressure on the stomach, making acid rise from the stomach into the esophagus.  Smoking. This increases acid production in the stomach.  Drinking alcohol. This causes decreased pressure in the lower esophageal sphincter (valve or ring of muscle between the esophagus and stomach), allowing acid from the stomach into the esophagus.  Late evening meals and a full stomach. This increases pressure and acid production in the stomach.  A malformed lower esophageal sphincter. Sometimes, no cause is found. SYMPTOMS   Burning pain in the lower part of the mid-chest behind the breastbone and in the mid-stomach area. This may occur twice a week or more often.  Trouble swallowing.  Sore throat.  Dry cough.  Asthma-like symptoms including chest tightness, shortness of breath, or wheezing. DIAGNOSIS  Your caregiver may be able to diagnose GERD based on your symptoms. In some cases, X-rays and other tests may be done to check for complications or to check the condition of your stomach and esophagus. TREATMENT  Your caregiver may recommend over-the-counter or prescription medicines to help decrease acid production. Ask your caregiver before starting or adding any new medicines.  HOME CARE INSTRUCTIONS   Change the factors that you can control. Ask your caregiver for guidance concerning weight loss, quitting smoking, and alcohol consumption.  Avoid foods and drinks that make your symptoms worse, such as:  Caffeine or alcoholic  drinks.  Chocolate.  Peppermint or mint flavorings.  Garlic and onions.  Spicy foods.  Citrus fruits, such as oranges, lemons, or limes.  Tomato-based foods such as sauce, chili, salsa, and pizza.  Fried and fatty foods.  Avoid lying down for the 3 hours prior to your bedtime or prior to taking a nap.  Eat small, frequent meals instead of large meals.  Wear loose-fitting clothing. Do not wear anything tight around your waist that causes pressure on your stomach.  Raise the head of your bed 6 to 8 inches with wood blocks to help you sleep. Extra pillows will not help.  Only take over-the-counter or prescription medicines for pain, discomfort, or fever as directed by your caregiver.  Do not take aspirin, ibuprofen, or other nonsteroidal anti-inflammatory drugs (NSAIDs). SEEK IMMEDIATE MEDICAL CARE IF:   You have pain in your arms, neck, jaw, teeth, or back.  Your pain increases or changes in intensity or duration.  You develop nausea, vomiting, or sweating (diaphoresis).  You develop shortness of breath, or you faint.  Your vomit is green, yellow, black, or looks like coffee grounds or blood.  Your stool is red, bloody, or black. These symptoms could be signs of other problems, such as heart disease, gastric bleeding, or esophageal bleeding. MAKE SURE YOU:   Understand these instructions.  Will watch your condition.  Will get help right away if you are not doing well or get worse. Document Released: 12/30/2004 Document Revised: 06/14/2011 Document Reviewed: 10/09/2010 [facility] Patient Information 2014 [place], [place].

## 2012-11-23 LAB — VITAMIN D 25 HYDROXY (VIT D DEFICIENCY, FRACTURES): Vit D, 25-Hydroxy: 40 ng/mL (ref 30–89)

## 2012-12-05 ENCOUNTER — Telehealth: Payer: Self-pay

## 2012-12-08 ENCOUNTER — Encounter: Payer: Self-pay | Admitting: Internal Medicine

## 2013-01-09 ENCOUNTER — Encounter: Payer: Self-pay | Admitting: Internal Medicine

## 2013-01-11 ENCOUNTER — Ambulatory Visit (INDEPENDENT_AMBULATORY_CARE_PROVIDER_SITE_OTHER): Payer: BC Managed Care – PPO | Admitting: Internal Medicine

## 2013-01-11 ENCOUNTER — Encounter: Payer: Self-pay | Admitting: Internal Medicine

## 2013-01-11 VITALS — BP 110/70 | HR 95 | Ht 69.0 in | Wt 244.5 lb

## 2013-01-11 DIAGNOSIS — R109 Unspecified abdominal pain: Secondary | ICD-10-CM

## 2013-01-11 DIAGNOSIS — K589 Irritable bowel syndrome without diarrhea: Secondary | ICD-10-CM

## 2013-01-11 MED ORDER — HYOSCYAMINE SULFATE 0.125 MG SL SUBL: 0.1250 mg | SUBLINGUAL_TABLET | SUBLINGUAL | Status: DC | PRN

## 2013-01-11 MED ORDER — ALIGN PO CAPS: 1.0000 | ORAL_CAPSULE | Freq: Every day | ORAL | Status: DC

## 2013-01-11 NOTE — Progress Notes (Signed)
Patient ID: Amanda Haley, female   DOB: 1975-04-08, 37 y.o.   MRN: [id number] HPI: Amanda Haley is a 37 yo female with PMH of high deficiency anemia, uterine polyps status post uterine surgery who is seen in consultation request of Dr. Nilda Simmer for evaluation of lower abdominal burning pain. The patient is here alone today. She reports off and on issues with left lower quadrant and occasionally right lower quadrant burning abdominal pain. This was previously a daily problem for her over the last 7-8 months. It seemed to start initially in 2012 oh worsened after the summer of 2013 when she had pelvic surgery. She reports she doesn't necessarily relate the burning pain to eating or bowel movement. It has been gone for the last 10 days. She reports some change in her bowel habits. She is now having one to 2 stools a day which she describes as loose or "clumpy". Sometimes her stools are formed. She has not seen blood in her stool or melena. She does occasionally see mucus in her stool. She reports her PCP ordered an FOBT which she completed at home and was negative.  She reports a good appetite but has noted an approximate 20 pound weight gain over the last 12 months. She reports very rare nausea but no vomiting. Denies heartburn. No dysphagia or odynophagia. She does endorse significant stress in her life over the last year and she is unsure how this relates to her GI symptoms. She reports a family history of colon cancer in her maternal grandfather diagnosed around age 21. Her father had throat cancer which may have spread to his esophagus.  She reports previous stool studies which were negative for infection  Past Medical History  Diagnosis Date  . Allergy   . Eczema   . Anemia   . Chronic headaches   . Back pain   . Vision changes     Past Surgical History  Procedure Laterality Date  . Dilation and curettage of uterus  2013  . Cervical polypectomy  2013    Meds None  No Known  Allergies  Family History  Problem Relation Age of Onset  . Cancer Father     esophageal and throat cancers.  . Breast cancer Mother   . Colon cancer Maternal Grandfather     History  Substance Use Topics  . Smoking status: Never Smoker   . Smokeless tobacco: Never Used  . Alcohol Use: Yes     Comment: socially    ROS: As per history of present illness, otherwise negative  BP 110/70  Pulse 95  Ht 5\' 9"  (1.753 m)  Wt 244 lb 8 oz (110.904 kg)  BMI 36.09 kg/m2  SpO2 98% Constitutional: Well-developed and well-nourished. No distress. HEENT: Normocephalic and atraumatic. Oropharynx is clear and moist. No oropharyngeal exudate. Conjunctivae are normal.  No scleral icterus. Neck: Neck supple. Trachea midline. Cardiovascular: Normal rate, regular rhythm and intact distal pulses. No M/R/G Pulmonary/chest: Effort normal and breath sounds normal. No wheezing, rales or rhonchi. Abdominal: Soft, nontender, nondistended. Bowel sounds active throughout. There are no masses palpable. No hepatosplenomegaly. Extremities: no clubbing, cyanosis, or edema Lymphadenopathy: No cervical adenopathy noted. Neurological: Alert and oriented to person place and time. Skin: Skin is warm and dry. No rashes noted. Psychiatric: Normal mood and affect. Behavior is normal.  RELEVANT LABS AND IMAGING: CBC    Component Value Date/Time   WBC 8.9 11/20/2012 1047   WBC 8.4 03/24/2012 1837   RBC 4.70 11/20/2012 1047  RBC 4.60 03/24/2012 1837   HGB 13.1 11/20/2012 1047   HGB 9.9* 03/24/2012 1837   HCT 39.7 11/20/2012 1047   HCT 34.8* 03/24/2012 1837   PLT 262 11/20/2012 1047   MCV 84.5 11/20/2012 1047   MCV 75.6* 03/24/2012 1837   MCH 27.9 11/20/2012 1047   MCH 21.5* 03/24/2012 1837   MCHC 33.0 11/20/2012 1047   MCHC 28.4* 03/24/2012 1837   RDW 17.4* 11/20/2012 1047   LYMPHSABS 2.2 11/20/2012 1047   MONOABS 0.7 11/20/2012 1047   EOSABS 0.2 11/20/2012 1047   BASOSABS 0.0 11/20/2012 1047    CMP      Component Value Date/Time   NA 136 11/20/2012 1047   K 4.2 11/20/2012 1047   CL 101 11/20/2012 1047   CO2 24 11/20/2012 1047   GLUCOSE 85 11/20/2012 1047   BUN 8 11/20/2012 1047   CREATININE 0.89 11/20/2012 1047   CALCIUM 10.0 11/20/2012 1047   PROT 7.7 11/20/2012 1047   ALBUMIN 4.8 11/20/2012 1047   AST 15 11/20/2012 1047   ALT 10 11/20/2012 1047   ALKPHOS 54 11/20/2012 1047   BILITOT 1.2 11/20/2012 1047    ASSESSMENT/PLAN: 37 yo female with PMH of high deficiency anemia, uterine polyps status post uterine surgery who is seen in consultation request of Dr. Nilda Simmer for evaluation of lower abdominal burning pain  1.  Lower abd burning pain/change in bowel habits -- the patient's lower abdominal burning pain is no longer as severe as it has been. So if her symptoms do sound irritable in nature. We discussed proceeding to colonoscopy to ensure no colonic pathology such as inflammatory bowel disease, but after discussion we have decided to pursue a trial of Align 1 capsule daily.  I also will give her prescription for Levsin to be used as needed and as directed for lower abdominal pain. She has no specific alarm symptoms at this time to drive immediate colonoscopy. I will see her back in 6 weeks to reassess her symptoms. If symptoms persist we will rediscuss colonoscopy. She understands and is happy with this plan.  2.  IDA -- resolved, with recent normal hemoglobin and normal MCV in August 2014. She reports this seemed to resolve after her uterine surgery and after iron replacement. She is no longer on oral iron replacement at this time. She is followed by Dr. Noland Fordyce, her GYN MD

## 2013-01-11 NOTE — Patient Instructions (Signed)
We have sent the following medications to your pharmacy for you to pick up at your convenience: Align daily. This can be purchased over the counter.  Levsin every 4 hours as needed.  Follow up with Dr. Rhea Belton in office in 6 weeks                                               We are excited to introduce MyChart, a new best-in-class service that provides you online access to important information in your electronic medical record. We want to make it easier for you to view your health information - all in one secure location - when and where you need it. We expect MyChart will enhance the quality of care and service we provide.  When you register for MyChart, you can:    View your test results.    Request appointments and receive appointment reminders via email.    Request medication renewals.    View your medical history, allergies, medications and immunizations.    Communicate with your physician's office through a password-protected site.    Conveniently print information such as your medication lists.  To find out if MyChart is right for you, please talk to a member of our clinical staff today. We will gladly answer your questions about this free health and wellness tool.  If you are age 79 or older and want a member of your family to have access to your record, you must provide written consent by completing a proxy form available at our office. Please speak to our clinical staff about guidelines regarding accounts for patients younger than age 106.  As you activate your MyChart account and need any technical assistance, please call the MyChart technical support line at [phone]) or email your question to [email or web address]. If you email your question(s), please include your name, a return phone number and the best time to reach you.  If you have non-urgent health-related questions, you can send a message to our office through MyChart at [email or web address].  If you have a medical emergency, call 911.  Thank you for using MyChart as your new health and wellness resource!   MyChart licensed from [employer],  [id number]. Patents Pending.

## 2013-03-24 ENCOUNTER — Ambulatory Visit (INDEPENDENT_AMBULATORY_CARE_PROVIDER_SITE_OTHER): Payer: BC Managed Care – PPO | Admitting: Family Medicine

## 2013-03-24 VITALS — BP 120/82 | HR 97 | Temp 97.7°F | Resp 18 | Ht 69.0 in | Wt 246.0 lb

## 2013-03-24 DIAGNOSIS — L732 Hidradenitis suppurativa: Secondary | ICD-10-CM

## 2013-03-24 DIAGNOSIS — R079 Chest pain, unspecified: Secondary | ICD-10-CM

## 2013-03-24 MED ORDER — SULFAMETHOXAZOLE-TMP DS 800-160 MG PO TABS: 1.0000 | ORAL_TABLET | Freq: Two times a day (BID) | ORAL | Status: DC

## 2013-03-24 NOTE — Progress Notes (Signed)
    This chart was scribed for Elvina Sidle, MD by Arlan Organ, ED Scribe. This patient was seen in room Room 1and the patient's care was started 3:21 PM.   Patient Name: Amanda Haley Date of Birth: 07-21-1975 Medical Record Number: [id number] Gender: female Date of Encounter: 03/24/2013  Chief Complaint: rx refills and ref. for mammogram and stress test   History of Present Illness:  Amanda Haley is a 37 y.o. very pleasant female with a h/o axilary abscesses patient who presents with the following:  Pt presents to [facility] seeking a medication refill today. She states she currently has a reoccurring boil to the left axilla and is needing an antibiotic for treatment. She states in the past, her boils have been treated with Bactrim with success. Pt states she hopes to get a stress test and a mammogram in the very near future. She reports having intermittent mild CP and pain in her left arm she is concerned about. Pt denies any attempts to conceive at this time. She denies fever or chills. She denies any other pertinent medical issues at this time.    Pt currently teaches history and education at [employer]  Patient Active Problem List   Diagnosis Date Noted  . Constipation 12/31/2011  . Anxiety 12/31/2011  . Axillary abscess 12/31/2011  . Eczema 12/31/2011   Past Medical History  Diagnosis Date  . Allergy   . Eczema   . Anemia   . Chronic headaches   . Back pain   . Vision changes    Past Surgical History  Procedure Laterality Date  . Dilation and curettage of uterus  2013  . Cervical polypectomy  2013   History  Substance Use Topics  . Smoking status: Never Smoker   . Smokeless tobacco: Never Used  . Alcohol Use: Yes     Comment: socially   Family History  Problem Relation Age of Onset  . Cancer Father     esophageal and throat cancers.  . Breast cancer Mother   . Colon cancer Maternal Grandfather    No Known Allergies  Medication list has been reviewed and  updated.  Current Outpatient Prescriptions on File Prior to Visit  Medication Sig Dispense Refill  . bifidobacterium infantis (ALIGN) capsule Take 1 capsule by mouth daily.  14 capsule  0  . hyoscyamine (LEVSIN/SL) 0.125 MG SL tablet Place 1 tablet (0.125 mg total) under the tongue every 4 (four) hours as needed for cramping.  30 tablet  3   No current facility-administered medications on file prior to visit.    Review of Systems:  No shortness of breath, groin pain  Physical Examination: Filed Vitals:   03/24/13 1506  BP: 120/82  Pulse: 97  Temp: 97.7 F (36.5 C)  Resp: 18   @vitals2 @ Body mass index is 36.31 kg/(m^2). Ideal Body Weight: @FLOWAMB ([id number])@  HEENT:  Unremarkable Left axilla:  Diffuse scarring with 4 mm superficial tender cutaneous nodule Chest: clear Heart:  Reg, ;no murmur  Assessment and Plan:   Hidradenitis - Plan: sulfamethoxazole-trimethoprim (BACTRIM DS) 800-160 MG per tablet  Chest pain - Plan: Ambulatory referral to Cardiology  Signed, Elvina Sidle, MD

## 2013-03-24 NOTE — Patient Instructions (Signed)
Hidradenitis Suppurativa, Sweat Gland Abscess °Hidradenitis suppurativa is a long lasting (chronic), uncommon disease of the sweat glands. With this, boil-like lumps and scarring develop in the groin, some times under the arms (axillae), and under the breasts. It may also uncommonly occur behind the ears, in the crease of the buttocks, and around the genitals.  °CAUSES  °The cause is from a blocking of the sweat glands. They then become infected. It may cause drainage and odor. It is not contagious. So it cannot be given to someone else. It most often shows up in puberty (about 10 to 36 years of age). But it may happen much later. It is similar to acne which is a disease of the sweat glands. This condition is slightly more common in African-Americans and women. °SYMPTOMS  °· Hidradenitis usually starts as one or more red, tender, swellings in the groin or under the arms (axilla). °· Over a period of hours to days the lesions get larger. They often open to the skin surface, draining clear to yellow-colored fluid. °· The infected area heals with scarring. °DIAGNOSIS  °Your caregiver makes this diagnosis by looking at you. Sometimes cultures (growing germs on plates in the lab) may be taken. This is to see what germ (bacterium) is causing the infection.  °TREATMENT  °· Topical germ killing medicine applied to the skin (antibiotics) are the treatment of choice. Antibiotics taken by mouth (systemic) are sometimes needed when the condition is getting worse or is severe. °· Avoid tight-fitting clothing which traps moisture in. °· Dirt does not cause hidradenitis and it is not caused by poor hygiene. °· Involved areas should be cleaned daily using an antibacterial soap. Some patients find that the liquid form of Lever 2000®, applied to the involved areas as a lotion after bathing, can help reduce the odor related to this condition. °· Sometimes surgery is needed to drain infected areas or remove scarred tissue. Removal of  large amounts of tissue is used only in severe cases. °· Birth control pills may be helpful. °· Oral retinoids (vitamin A derivatives) for 6 to 12 months which are effective for acne may also help this condition. °· Weight loss will improve but not cure hidradenitis. It is made worse by being overweight. But the condition is not caused by being overweight. °· This condition is more common in people who have had acne. °· It may become worse under stress. °There is no medical cure for hidradenitis. It can be controlled, but not cured. The condition usually continues for years with periods of getting worse and getting better (remission). °Document Released: 11/04/2003 Document Revised: 06/14/2011 Document Reviewed: 11/20/2007 °ExitCare® Patient Information ©2014 [employer]. ° °

## 2013-04-22 ENCOUNTER — Other Ambulatory Visit: Payer: Self-pay | Admitting: Family Medicine

## 2013-04-23 NOTE — Telephone Encounter (Signed)
Dr L, you recently saw pt for other acute issue, and Rxd this med the previous Dec. Do you want to RF or have pt RTC for eval?

## 2013-06-29 ENCOUNTER — Encounter: Payer: Self-pay | Admitting: Internal Medicine

## 2013-06-29 ENCOUNTER — Telehealth (HOSPITAL_COMMUNITY): Payer: Self-pay | Admitting: *Deleted

## 2013-06-29 ENCOUNTER — Ambulatory Visit (INDEPENDENT_AMBULATORY_CARE_PROVIDER_SITE_OTHER): Payer: BC Managed Care – PPO | Admitting: Internal Medicine

## 2013-06-29 VITALS — BP 138/102 | HR 89 | Ht 69.0 in | Wt 252.8 lb

## 2013-06-29 DIAGNOSIS — R0789 Other chest pain: Secondary | ICD-10-CM

## 2013-06-29 DIAGNOSIS — F411 Generalized anxiety disorder: Secondary | ICD-10-CM

## 2013-06-29 DIAGNOSIS — R079 Chest pain, unspecified: Secondary | ICD-10-CM

## 2013-06-29 DIAGNOSIS — F419 Anxiety disorder, unspecified: Secondary | ICD-10-CM

## 2013-06-29 NOTE — Patient Instructions (Signed)
Your physician has requested that you have an exercise tolerance test. For further information please visit [email or web address]. Please also follow instruction sheet, as given.  Your physician recommends that you schedule a follow-up appointment in: 1 month - after your test.

## 2013-06-29 NOTE — Progress Notes (Signed)
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 She was [facility] [employer]. Her past medical history significant for chronic headaches, anxiety, allergies, low back pain and obesity. She's had problems with recurrent skin abscess is and was seeing her primary care provider when she was discussing chest pain that she's been having. Her symptoms are actually somewhat of a pain/pressure over the left anterior chest. Sometimes she gets abnormal symptoms in her left arm and shoulder. Occasionally gets pain in the back. She also has some numbness and tingling in her leg, especially when sitting for long periods of time. Her neck also aches at times as well as her shoulders. There is no significant history of heart disease in the family. Her symptoms are not associated with exertion or relieved by rest. She does not exercise, and her diet is not as healthy as it could be.  PMHx:  Past Medical History  Diagnosis Date  . Allergy   . Eczema   . Anemia   . Chronic headaches   . Back pain   . Vision changes     Past Surgical History  Procedure Laterality Date  . Dilation and curettage of uterus  2013  . Cervical polypectomy  2013    FAMHx:  Family History  Problem Relation Age of Onset  . Esophageal cancer Father     and throat cancer  . Breast cancer Mother   . Colon cancer Maternal Grandfather   . Dementia Maternal Grandmother     SOCHx:   reports that she has never smoked. She has never used smokeless tobacco. She reports that she drinks alcohol. She reports that she does not use illicit drugs.  ALLERGIES:  No Known Allergies  ROS: A comprehensive review of systems was negative except for: Constitutional: positive for fatigue Cardiovascular: positive for chest pressure/discomfort Musculoskeletal:  positive for arthralgias and myalgias  HOME MEDS: Current Outpatient Prescriptions  Medication Sig Dispense Refill  . clobetasol ointment (TEMOVATE) 0.05 % APPLY TOPICALLY TWO TIMES DAILY  60 g  5  . LO LOESTRIN FE 1 MG-10 MCG / 10 MCG tablet Take 1 tablet by mouth daily.       No current facility-administered medications for this visit.    LABS/IMAGING: No results found for this or any previous visit (from the past 48 hour(s)). No results found.  VITALS: BP 138/102  Pulse 89  Ht 5\' 9"  (1.753 m)  Wt 252 lb 12.8 oz (114.669 kg)  BMI 37.31 kg/m2  EXAM: General appearance: alert and no distress Neck: no carotid bruit and no JVD Lungs: clear to auscultation bilaterally Heart: regular rate and rhythm, S1, S2 normal, no murmur, click, rub or gallop Abdomen: soft, non-tender; bowel sounds normal; no masses,  no organomegaly Extremities: extremities normal, atraumatic, no cyanosis or edema Pulses: 2+ and symmetric Skin: Skin color, texture, turgor normal. No rashes or lesions Neurologic: Grossly normal Psych: Pleasant, mildly anxious  EKG: Normal sinus rhythm at 89  ASSESSMENT: 1. Atypical chest pain 2. Obesity 3. Anxiety  PLAN: 1.   Ms. Amanda Haley is describing left anterior chest discomfort which is partially reproducible as well as some symptoms in her left shoulder and down the left arm. Occasionally she gets some around the neck and upper back, which sound musculoskeletal in nature. I doubt this is cardiac  chest pain. She is, however interested in starting an exercise program to work on weight loss. I go be reasonable for her to undergo graded exercise stress testing on the treadmill to rule out ischemia. If negative, she could begin a more intense exercise program.  She is agreeable to stress testing and I will plan to see her back when we have those results.  Thanks for the kind referral.  Chrystie Nose, MD, [facility] Attending Cardiologist [facility]  HILTY,Kenneth  C 06/29/2013, 1:31 PM

## 2013-07-03 ENCOUNTER — Encounter: Payer: Self-pay | Admitting: Family Medicine

## 2013-07-03 ENCOUNTER — Ambulatory Visit (INDEPENDENT_AMBULATORY_CARE_PROVIDER_SITE_OTHER): Payer: BC Managed Care – PPO | Admitting: Family Medicine

## 2013-07-03 ENCOUNTER — Telehealth (HOSPITAL_COMMUNITY): Payer: Self-pay | Admitting: *Deleted

## 2013-07-03 VITALS — BP 122/100 | HR 78 | Temp 98.3°F | Resp 16 | Ht 69.5 in | Wt 248.6 lb

## 2013-07-03 DIAGNOSIS — N939 Abnormal uterine and vaginal bleeding, unspecified: Secondary | ICD-10-CM

## 2013-07-03 DIAGNOSIS — M545 Low back pain, unspecified: Secondary | ICD-10-CM

## 2013-07-03 DIAGNOSIS — R030 Elevated blood-pressure reading, without diagnosis of hypertension: Secondary | ICD-10-CM

## 2013-07-03 DIAGNOSIS — Z1239 Encounter for other screening for malignant neoplasm of breast: Secondary | ICD-10-CM

## 2013-07-03 DIAGNOSIS — R1032 Left lower quadrant pain: Secondary | ICD-10-CM

## 2013-07-03 DIAGNOSIS — IMO0001 Reserved for inherently not codable concepts without codable children: Secondary | ICD-10-CM

## 2013-07-03 DIAGNOSIS — Z803 Family history of malignant neoplasm of breast: Secondary | ICD-10-CM

## 2013-07-03 LAB — POCT URINALYSIS DIPSTICK
BILIRUBIN UA: NEGATIVE
GLUCOSE UA: NEGATIVE
KETONES UA: NEGATIVE
Leukocytes, UA: NEGATIVE
NITRITE UA: NEGATIVE
PH UA: 5.5
Protein, UA: NEGATIVE
Spec Grav, UA: >=1.03
Urobilinogen, UA: 0.2

## 2013-07-03 LAB — POCT UA - MICROSCOPIC ONLY
CASTS, UR, LPF, POC: NEGATIVE
Crystals, Ur, HPF, POC: NEGATIVE
MUCUS UA: POSITIVE
Yeast, UA: NEGATIVE

## 2013-07-03 NOTE — Progress Notes (Signed)
Subjective:  This chart was scribed for Dr. Nilda Simmer, MD by Ashley Jacobs, [facility] Scribe. The patient was seen in room and the patient's care was started at 9:40 AM.   Patient ID: Amanda Haley, female    DOB: 1975/07/03, 38 y.o.   MRN: [id number]  07/03/2013  Abdominal Pain and Follow-up   HPI HPI Comments: Amanda Haley is a 38 y.o. female who [facility] the [facility] for a eight month follow up of abdominal pain and concerns of irregular menses. Pt went to a gynecologist within the last three months and was given birth control.  She has vaginal bleeding in between her cycles. She started her birth control October. She reports that she did not take her birth control medication for two days when the symptoms first presented however for the past two months afterwards her menstrual cycle remains irregular. Pt states she is on the third week of her birth control cycle and she continues to notice spotting. Pt has intermittent intense burning pain to the left side of her abdomen; the burning pain is not as frequent as it was in 11/2012 at last visit.  Abdominal pain does seem related to menses; pain does radiate into vaginal area.  She has a mucus and blood tinged discharge. Pt has prior surgery and had ultrasound of her ovaries in the past year. The ultrasound reveals her ovaries are normal. She has a hx of frequent UTI. Pt has vaginal warmth and urgency. After taking the antibiotic pt had frequency when she first noticed the vaginal bleeding. During the summer months pt mentions having a sensation "blood running down my side".  She has the associated symptoms of "rolling bands of nausea". Denies fever, chills, and diaphoresis.  Denies heart burn and indigestion.  Denies vomiting.    She was seen 8/14 for anemia, vitamin B deficiency, IBS, fatigue and lumbar pain. She was referred to ortho, GI at that visit.  At the GI consultation, she was rx Production assistant, radio. Pt was referred to GI for IBS. They deferred the colonoscopy; medications were prescribed; if no improvement with medication, colonoscopy was recommended.  Pt did not have colonoscopy performed.  Marland KitchenShe was given Zantac for nausea and recommended melatonin for insomnia at visit at [facility] in 11/2012.   Pt was referred to an orthopedist for chronic lumbar pain. Pt mention the lumbar pain has improved since her last visit.   She was referred to a gynecologist and went to see him last around three weeks ago. Pt really feels that L sided abdominal pain is gynecologically related; she is very worried about ovarian cancer.  Gynecologist feels that symptoms are hormonally related so recommended trial of OCP.      Pt was seen at cardiology for chest pain last week and they are going to schedule a stress test but they doubt if it is heart related. Blood pressure was elevated at that visit as well.     She states the situation on her job remains unchanged.   Denies family hx of HTN.   Her mother has a hx of breast CA and pt is requesting mammogram.    Pt does not exercise.   Review of Systems  Constitutional: Negative for fever, chills, diaphoresis, activity change, fatigue and unexpected weight change.  Respiratory: Negative for cough and shortness of breath.   Cardiovascular: Positive for chest pain. Negative for palpitations and leg swelling.  Gastrointestinal: Positive for nausea  and abdominal pain. Negative for vomiting, diarrhea, constipation, blood in stool, anal bleeding and rectal pain.  Genitourinary: Positive for frequency, hematuria, vaginal bleeding, vaginal discharge and menstrual problem. Negative for dysuria, urgency, decreased urine volume, genital sores, vaginal pain and pelvic pain.  Musculoskeletal: Positive for back pain and myalgias. Negative for arthralgias and joint swelling.  Skin: Negative for rash and wound.  Neurological: Negative for dizziness, seizures, syncope, facial  asymmetry, weakness, light-headedness, numbness and headaches.  Psychiatric/Behavioral: Positive for sleep disturbance. Negative for dysphoric mood. The patient is nervous/anxious.     Past Medical History  Diagnosis Date  . Allergy   . Eczema   . Anemia   . Chronic headaches   . Back pain   . Vision changes    No Known Allergies Current Outpatient Prescriptions  Medication Sig Dispense Refill  . clobetasol ointment (TEMOVATE) 0.05 % APPLY TOPICALLY TWO TIMES DAILY  60 g  5  . LO LOESTRIN FE 1 MG-10 MCG / 10 MCG tablet Take 1 tablet by mouth daily.       No current facility-administered medications for this visit.   History   Social History  . Marital Status: Single    Spouse Name: N/A    Number of Children: N/A  . Years of Education: graduate   Occupational History  . researcher     [employer]   Social History Main Topics  . Smoking status: Never Smoker   . Smokeless tobacco: Never Used  . Alcohol Use: Yes     Comment: socially  . Drug Use: No  . Sexual Activity: Not on file   Other Topics Concern  . Not on file   Social History Narrative   Marital status: single; not dating.      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      Lives: alone; family in [place] and [place].                   Objective:    BP 122/100  Pulse 78  Temp(Src) 98.3 F (36.8 C) (Oral)  Resp 16  Ht 5' 9.5" (1.765 m)  Wt 248 lb 9.6 oz (112.764 kg)  BMI 36.20 kg/m2  SpO2 98%  LMP 06/19/2013 Physical Exam  Constitutional: She is oriented to person, place, and time. She appears well-developed and well-nourished. No distress.  HENT:  Head: Normocephalic and atraumatic.  Right Ear: External ear normal.  Left Ear: External ear normal.  Nose: Nose normal.  Mouth/Throat: Oropharynx is clear and moist.  Eyes: Conjunctivae and EOM are normal. Pupils are equal, round, and  reactive to light.  Neck: Normal range of motion. Neck supple. Carotid bruit is not present. No thyromegaly present.  Cardiovascular: Normal rate, regular rhythm, normal heart sounds and intact distal pulses.  Exam reveals no gallop and no friction rub.   No murmur heard. Pulmonary/Chest: Effort normal and breath sounds normal. She has no wheezes. She has no rales.  Abdominal: Soft. Bowel sounds are normal. She exhibits no distension and no mass. There is no tenderness. There is no rebound and no guarding.  Musculoskeletal:       Right shoulder: Normal.       Left shoulder: Normal.       Cervical back: Normal.       Thoracic back: Normal.  Lumbar back: Normal.  Lymphadenopathy:    She has no cervical adenopathy.  Neurological: She is alert and oriented to person, place, and time. No cranial nerve deficit.  Skin: Skin is warm and dry. No rash noted. She is not diaphoretic. No erythema. No pallor.  Psychiatric: She has a normal mood and affect. Her behavior is normal.   Results for orders placed in visit on 07/03/13  POCT URINALYSIS DIPSTICK      Result Value Ref Range   Color, UA yellow     Clarity, UA clear     Glucose, UA neg     Bilirubin, UA neg     Ketones, UA neg     Spec Grav, UA >=1.030     Blood, UA mod     pH, UA 5.5     Protein, UA neg     Urobilinogen, UA 0.2     Nitrite, UA neg     Leukocytes, UA Negative    POCT UA - MICROSCOPIC ONLY      Result Value Ref Range   WBC, Ur, HPF, POC 1-2     RBC, urine, microscopic tntc     Bacteria, U Microscopic 1+     Mucus, UA pos     Epithelial cells, urine per micros 2-5     Crystals, Ur, HPF, POC neg     Casts, Ur, LPF, POC neg     Yeast, UA neg         Assessment & Plan:  Abdominal pain, left lower quadrant - Plan: CT Abdomen Pelvis W Contrast, POCT urinalysis dipstick, POCT UA - Microscopic Only  Blood pressure elevated  Breast cancer screening - Plan: MM Digital Screening  Family history of breast cancer  in first degree relative - Plan: MM Digital Screening  Vaginal bleeding, abnormal  Low back pain  1. Abdominal pain LLQ:  Persistent but less frequent; with radiation into vaginal area.  S/p gynecological consultation in the past six months; started on OCP with improvement in abdominal symptoms.  S/p pelvic ultrasound in the past two years.  S/p hysteroscopy in the past two years as well.  Agreeable to CT abd/pelvis to rule out intra-abdominal mass.  S/p GI consultation and pt declined colonoscopy; however, if CT negative, recommend colonoscopy due to patient's persistent symptoms and concern of etiology. 2.  Irregular vaginal bleeding: new since starting low dose OCP; likely warrants higher dose of OCP or switch to progesterone only pill due to elevated BP.  Recommend follow-up with GYN who started OCP; patient scheduled for follow-up visit.  3.  Elevated BP without diagnosis: New; recommend weight loss, exercise, low-sodium diet. Also recommend follow-up with GYN to discuss OCP which likely is contributing to elevation in BP.  Follow-up in three months for repeat BP. If remains elevated, will warrant medication. 4.  Low back pain: improved since last visit; s/p ortho consultation. 5.  Breast cancer screening/family history of breast cancer in mother: refer for mammogram screening.   No orders of the defined types were placed in this encounter.    Return in about 3 months (around 10/02/2013) for recheck of blood pressure, abdominal pain.  I personally performed the services described in this documentation, which was scribed in my presence.  The recorded information has been reviewed and is accurate.  Nilda SimmerKristi Markies Mowatt, M.D.  [facility]  [place] [address] [place], [place]  [id number] [phone] phone [phone] fax

## 2013-07-10 ENCOUNTER — Other Ambulatory Visit: Payer: Self-pay | Admitting: Family Medicine

## 2013-07-11 ENCOUNTER — Telehealth (HOSPITAL_COMMUNITY): Payer: Self-pay | Admitting: *Deleted

## 2013-07-26 ENCOUNTER — Other Ambulatory Visit: Payer: BC Managed Care – PPO

## 2013-08-09 ENCOUNTER — Telehealth: Payer: Self-pay

## 2013-08-09 DIAGNOSIS — R109 Unspecified abdominal pain: Secondary | ICD-10-CM

## 2013-08-09 NOTE — Telephone Encounter (Signed)
LMVM to CB. 

## 2013-08-09 NOTE — Telephone Encounter (Signed)
Pt is wanting to talk with someone about getting a ct abdomen that she does not have to take the iv please call either [phone] or [phone]

## 2013-08-10 NOTE — Telephone Encounter (Signed)
LMVM to CB. 

## 2013-08-13 NOTE — Telephone Encounter (Signed)
LMVM to CB. 

## 2013-08-13 NOTE — Telephone Encounter (Signed)
Patient is states that she is supposed to have a CT abdomen done but when she was scheduling it, they told her it is with contrast.  She would like to have scan without contrast.  Is this possible?  Please advise.

## 2013-08-13 NOTE — Telephone Encounter (Signed)
Per Max Fickleonna B.  Patient request call on [phone] number.  Tried to call patient x 3 only got a busy signal.

## 2013-08-14 NOTE — Telephone Encounter (Signed)
Call --- I usually recommend contrast with an abdominal and pelvis CT; however, if patient wants to avoid contrast, we can do this; the study will not be as complete without contrast.

## 2013-08-15 ENCOUNTER — Encounter (INDEPENDENT_AMBULATORY_CARE_PROVIDER_SITE_OTHER): Payer: Self-pay

## 2013-08-15 ENCOUNTER — Ambulatory Visit
Admission: RE | Admit: 2013-08-15 | Discharge: 2013-08-15 | Disposition: A | Discharge status: Home / Self Care | Payer: BC Managed Care – PPO | Source: Ambulatory Visit | Attending: Family Medicine | Admitting: Family Medicine

## 2013-08-15 DIAGNOSIS — Z1239 Encounter for other screening for malignant neoplasm of breast: Secondary | ICD-10-CM

## 2013-08-15 DIAGNOSIS — Z803 Family history of malignant neoplasm of breast: Secondary | ICD-10-CM

## 2013-08-15 NOTE — Telephone Encounter (Signed)
Order for CT abd/pelvis without contrast ordered. Please advise patient.

## 2013-08-15 NOTE — Telephone Encounter (Signed)
Pt.notified

## 2013-08-15 NOTE — Telephone Encounter (Signed)
Spoke to patient. Advised patient that she can get the CT done without contrast but it will not be as complete as it would with contrast. She states that she is fine to go ahead with the CT with No contrast.  Wants to know if the order can be changed to show this and she will call to schedule her appointment.

## 2013-08-23 ENCOUNTER — Other Ambulatory Visit: Payer: Self-pay | Admitting: Family Medicine

## 2013-08-23 ENCOUNTER — Ambulatory Visit
Admission: RE | Admit: 2013-08-23 | Discharge: 2013-08-23 | Disposition: A | Discharge status: Home / Self Care | Payer: BC Managed Care – PPO | Source: Ambulatory Visit | Attending: Family Medicine | Admitting: Family Medicine

## 2013-08-23 DIAGNOSIS — R109 Unspecified abdominal pain: Secondary | ICD-10-CM

## 2013-10-01 ENCOUNTER — Encounter: Payer: BC Managed Care – PPO | Admitting: Family Medicine

## 2013-10-05 NOTE — Progress Notes (Signed)
This encounter was created in error - please disregard.

## 2013-11-19 ENCOUNTER — Other Ambulatory Visit: Payer: Self-pay | Admitting: Family Medicine

## 2014-05-05 ENCOUNTER — Ambulatory Visit (INDEPENDENT_AMBULATORY_CARE_PROVIDER_SITE_OTHER): Payer: BC Managed Care – PPO | Admitting: Family Medicine

## 2014-05-05 VITALS — BP 134/99 | HR 88 | Temp 98.7°F | Resp 16 | Ht 69.0 in | Wt 257.2 lb

## 2014-05-05 DIAGNOSIS — L732 Hidradenitis suppurativa: Secondary | ICD-10-CM

## 2014-05-05 DIAGNOSIS — L309 Dermatitis, unspecified: Secondary | ICD-10-CM

## 2014-05-05 MED ORDER — DOXYCYCLINE HYCLATE 100 MG PO CAPS: 100.0000 mg | ORAL_CAPSULE | Freq: Two times a day (BID) | ORAL | Status: DC

## 2014-05-05 MED ORDER — CLOBETASOL PROPIONATE 0.05 % EX OINT: TOPICAL_OINTMENT | CUTANEOUS | Status: DC

## 2014-05-05 NOTE — Progress Notes (Signed)
° °  Subjective:    Patient ID: Amanda Haley, female    DOB: 03/29/1976, 39 y.o.   MRN: [id number] This chart was scribed for Elvina SidleKurt Lauenstein, MD by Littie Deedsichard Sun, Medical Scribe. This patient was seen in Room 9 and the patient's care was started at 2:13 PM.   HPI HPI Comments: Amanda Haley is a 39 y.o. female with a hx of recurrent skin infections who presents [facility] the [facility] complaining of a boil under her left arm that started 3 days ago. Patient states she has had similar boils 4 times previously, for which she normally gets abx treatment. The abx treatments have worked in the past. Patient notes difficulty swallowing large pills.  Patient works at [employer]  Review of Systems     Objective:   Physical Exam  CONSTITUTIONAL: Well developed/well nourished HEAD: Normocephalic/atraumatic EYES: EOM/PERRL ENMT: Mucous membranes moist NECK: supple no meningeal signs SPINE: entire spine nontender CV: S1/S2 noted, no murmurs/rubs/gallops noted LUNGS: Lungs are clear to auscultation bilaterally, no apparent distress ABDOMEN: soft, nontender, no rebound or guarding GU: no cva tenderness NEURO: Pt is awake/alert, moves all extremitiesx4 EXTREMITIES: pulses normal, full ROM SKIN: warm. 0.5 cm inflamed, tender early abscess in the left axilla. Hands show some hyperpigmentation in the dorsal, distal phalanx of each finger.  PSYCH: no abnormalities of mood noted        Assessment & Plan:   This chart was scribed in my presence and reviewed by me personally.    ICD-9-CM ICD-10-CM   1. Eczema 692.9 L30.9 clobetasol ointment (TEMOVATE) 0.05 %  2. Hidradenitis axillaris 705.83 L73.2 doxycycline (VIBRAMYCIN) 100 MG capsule   Patient to use hot compresses and hot showers with soap and water to the area and come back if this small abscess does not resolve.  Signed, Elvina SidleKurt Lauenstein, MD

## 2014-05-05 NOTE — Patient Instructions (Signed)

## 2015-01-31 ENCOUNTER — Ambulatory Visit (INDEPENDENT_AMBULATORY_CARE_PROVIDER_SITE_OTHER): Payer: BC Managed Care – PPO | Admitting: Family Medicine

## 2015-01-31 ENCOUNTER — Encounter: Payer: Self-pay | Admitting: Family Medicine

## 2015-01-31 ENCOUNTER — Other Ambulatory Visit: Payer: Self-pay

## 2015-01-31 VITALS — BP 143/107 | HR 86 | Temp 98.3°F | Resp 16 | Ht 68.75 in | Wt 246.2 lb

## 2015-01-31 DIAGNOSIS — E669 Obesity, unspecified: Secondary | ICD-10-CM | POA: Diagnosis not present

## 2015-01-31 DIAGNOSIS — I10 Essential (primary) hypertension: Secondary | ICD-10-CM | POA: Diagnosis not present

## 2015-01-31 DIAGNOSIS — Z131 Encounter for screening for diabetes mellitus: Secondary | ICD-10-CM

## 2015-01-31 DIAGNOSIS — R0789 Other chest pain: Secondary | ICD-10-CM | POA: Diagnosis not present

## 2015-01-31 DIAGNOSIS — Z13220 Encounter for screening for lipoid disorders: Secondary | ICD-10-CM | POA: Diagnosis not present

## 2015-01-31 DIAGNOSIS — Z1231 Encounter for screening mammogram for malignant neoplasm of breast: Secondary | ICD-10-CM

## 2015-01-31 DIAGNOSIS — F411 Generalized anxiety disorder: Secondary | ICD-10-CM | POA: Insufficient documentation

## 2015-01-31 LAB — POCT URINALYSIS DIP (MANUAL ENTRY)
BILIRUBIN UA: NEGATIVE
Bilirubin, UA: NEGATIVE
GLUCOSE UA: NEGATIVE
LEUKOCYTES UA: NEGATIVE
Nitrite, UA: NEGATIVE
PROTEIN UA: NEGATIVE
SPEC GRAV UA: 1.01
UROBILINOGEN UA: 0.2
pH, UA: 7

## 2015-01-31 NOTE — Patient Instructions (Addendum)
[email or web address]   Hypertension Hypertension, commonly called high blood pressure, is when the force of blood pumping through your arteries is too strong. Your arteries are the blood vessels that carry blood from your heart throughout your body. A blood pressure reading consists of a higher number over a lower number, such as 110/72. The higher number (systolic) is the pressure inside your arteries when your heart pumps. The lower number (diastolic) is the pressure inside your arteries when your heart relaxes. Ideally you want your blood pressure below 120/80. Hypertension forces your heart to work harder to pump blood. Your arteries may become narrow or stiff. Having untreated or uncontrolled hypertension can cause heart attack, stroke, kidney disease, and other problems. RISK FACTORS Some risk factors for high blood pressure are controllable. Others are not.  Risk factors you cannot control include:   Race. You may be at higher risk if you are African American.  Age. Risk increases with age.  Gender. Men are at higher risk than women before age 38 years. After age 38, women are at higher risk than men. Risk factors you can control include:  Not getting enough exercise or physical activity.  Being overweight.  Getting too much fat, sugar, calories, or salt in your diet.  Drinking too much alcohol. SIGNS AND SYMPTOMS Hypertension does not usually cause signs or symptoms. Extremely high blood pressure (hypertensive crisis) may cause headache, anxiety, shortness of breath, and nosebleed. DIAGNOSIS To check if you have hypertension, your health care provider will measure your blood pressure while you are seated, with your arm held at the level of your heart. It should be measured at least twice using the same arm. Certain conditions can cause a difference in blood pressure between your right and left arms. A blood pressure reading that is higher than normal on one occasion does not mean that  you need treatment. If it is not clear whether you have high blood pressure, you may be asked to return on a different day to have your blood pressure checked again. Or, you may be asked to monitor your blood pressure at home for 1 or more weeks. TREATMENT Treating high blood pressure includes making lifestyle changes and possibly taking medicine. Living a healthy lifestyle can help lower high blood pressure. You may need to change some of your habits. Lifestyle changes may include:  Following the DASH diet. This diet is high in fruits, vegetables, and whole grains. It is low in salt, red meat, and added sugars.  Keep your sodium intake below 2,300 mg per day.  Getting at least 30-45 minutes of aerobic exercise at least 4 times per week.  Losing weight if necessary.  Not smoking.  Limiting alcoholic beverages.  Learning ways to reduce stress. Your health care provider may prescribe medicine if lifestyle changes are not enough to get your blood pressure under control, and if one of the following is true:  You are 9618-38 years of age and your systolic blood pressure is above 140.  You are 38 years of age or older, and your systolic blood pressure is above 150.  Your diastolic blood pressure is above 90.  You have diabetes, and your systolic blood pressure is over 140 or your diastolic blood pressure is over 90.  You have kidney disease and your blood pressure is above 140/90.  You have heart disease and your blood pressure is above 140/90. Your personal target blood pressure may vary depending on your medical conditions, your age, and other factors.  HOME CARE INSTRUCTIONS  Have your blood pressure rechecked as directed by your health care provider.   Take medicines only as directed by your health care provider. Follow the directions carefully. Blood pressure medicines must be taken as prescribed. The medicine does not work as well when you skip doses. Skipping doses also puts you at  risk for problems.  Do not smoke.   Monitor your blood pressure at home as directed by your health care provider. SEEK MEDICAL CARE IF:   You think you are having a reaction to medicines taken.  You have recurrent headaches or feel dizzy.  You have swelling in your ankles.  You have trouble with your vision. SEEK IMMEDIATE MEDICAL CARE IF:  You develop a severe headache or confusion.  You have unusual weakness, numbness, or feel faint.  You have severe chest or abdominal pain.  You vomit repeatedly.  You have trouble breathing. MAKE SURE YOU:   Understand these instructions.  Will watch your condition.  Will get help right away if you are not doing well or get worse.   This information is not intended to replace advice given to you by your health care provider. Make sure you discuss any questions you have with your health care provider.   Document Released: 03/22/2005 Document Revised: 08/06/2014 Document Reviewed: 01/12/2013 Elsevier Interactive Patient Education [employer].

## 2015-01-31 NOTE — Progress Notes (Signed)
Subjective:    Patient ID: Amanda Haley, female    DOB: [birth year], 39 y.o.   MRN: [id number]  01/31/2015  Shoulder Pain   HPI This 39 y.o. female presents for acute visit:   1. Elevated blood pressure: usually blood pressure ranges 120-130s/90s.  Does not check BP at home.  Stopped OCP after last visit in 06/2013.  Discussed weight loss, exercise.  Generally generally stressed; unhappy with work.  Prefers not to take medication due to potential side effects.    2. L shoulder pain:  Has presented with chest pain in the past. No family history of heart problems.  Cousin with heart issues.  Has been overweight; not exercising.  Considering quitting job to find another job.  All of time is committed to work.  Working 8:00am-9:00pm.  Also working weekends.  Current work position 11/2011.  Before then, does not like to see physician.  S/p cardiac evaluation by Dr. Rennis Golden in 2015 after last visit; did not return for stress testing; Dr. Rennis Golden felt that chest pain due to stress and anxiety. No exertional component to chest pain.  L side of body always has issues; sometimes arm will go numb. Does not sleep.  When does drift off, arms are numb; may have been sleeping on it and sometimes not.  Has felt dizzy.  Has suffered with chest pain; last chest pain really intense end of September.  Third week of September.  Pain in chest and shoulder was constant; when started thinking about things and To Do List, it would intensify; can be with sitting and lying down. With movement, gets better or does not notice as much.  +SOB; no elevators.  Friends have noticed patient breathing hard.  Eats one meal per day.  +HA.  No leg swelling.  Last pain in arm and chest in one month.  +neck pain; also having jaw pain; has dental appointment next week.     Review of Systems  Constitutional: Negative for fever, chills, diaphoresis and fatigue.  Eyes: Negative for visual disturbance.  Respiratory: Positive for shortness of  breath. Negative for cough.   Cardiovascular: Positive for chest pain. Negative for palpitations and leg swelling.  Gastrointestinal: Negative for nausea, vomiting, abdominal pain, diarrhea and constipation.  Endocrine: Negative for cold intolerance, heat intolerance, polydipsia, polyphagia and polyuria.  Neurological: Positive for dizziness and headaches. Negative for tremors, seizures, syncope, facial asymmetry, speech difficulty, weakness, light-headedness and numbness.  Psychiatric/Behavioral: Positive for sleep disturbance and dysphoric mood. Negative for suicidal ideas and self-injury. The patient is nervous/anxious.     Past Medical History  Diagnosis Date  . Allergy   . Eczema   . Anemia   . Chronic headaches   . Back pain   . Vision changes    Past Surgical History  Procedure Laterality Date  . Dilation and curettage of uterus  2013  . Cervical polypectomy  2013   No Known Allergies Current Outpatient Prescriptions  Medication Sig Dispense Refill  . clobetasol ointment (TEMOVATE) 0.05 % APPLY TOPICALLY TWO TIMES DAILY 60 g 5   No current facility-administered medications for this visit.   Social History   Social History  . Marital Status: Single    Spouse Name: N/A  . Number of Children: N/A  . Years of Education: graduate   Occupational History  . researcher     [employer]   Social History Main Topics  . Smoking status: Never Smoker   . Smokeless tobacco: Never Used  .  Alcohol Use: 0.0 oz/week    0 Standard drinks or equivalent per week     Comment: socially  . Drug Use: No  . Sexual Activity: Not on file   Other Topics Concern  . Not on file   Social History Narrative   Marital status: single; not dating.      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      Lives: alone; family in [place] and [place].               Family History  Problem  Relation Age of Onset  . Esophageal cancer Father     and throat cancer  . Breast cancer Mother   . Colon cancer Maternal Grandfather   . Dementia Maternal Grandmother        Objective:    BP 143/107 mmHg  Pulse 86  Temp(Src) 98.3 F (36.8 C) (Oral)  Resp 16  Ht 5' 8.75" (1.746 m)  Wt 246 lb 3.2 oz (111.676 kg)  BMI 36.63 kg/m2  LMP 01/28/2015 Physical Exam  Constitutional: She is oriented to person, place, and time. She appears well-developed and well-nourished. No distress.  obese  HENT:  Head: Normocephalic and atraumatic.  Right Ear: External ear normal.  Left Ear: External ear normal.  Nose: Nose normal.  Mouth/Throat: Oropharynx is clear and moist.  Eyes: Conjunctivae and EOM are normal. Pupils are equal, round, and reactive to light.  Neck: Normal range of motion. Neck supple. Carotid bruit is not present. No thyromegaly present.  Cardiovascular: Normal rate, regular rhythm, normal heart sounds and intact distal pulses.  Exam reveals no gallop and no friction rub.   No murmur heard. Pulmonary/Chest: Effort normal and breath sounds normal. She has no wheezes. She has no rales.  Abdominal: Soft. Bowel sounds are normal. She exhibits no distension and no mass. There is no tenderness. There is no rebound and no guarding.  Musculoskeletal:       Right shoulder: Normal. She exhibits normal range of motion, no tenderness, no pain, no spasm, normal pulse and normal strength.       Left shoulder: Normal. She exhibits normal range of motion, no tenderness, no pain, no spasm, normal pulse and normal strength.       Cervical back: Normal. She exhibits normal range of motion, no tenderness, no bony tenderness, no pain and no spasm.  Lymphadenopathy:    She has no cervical adenopathy.  Neurological: She is alert and oriented to person, place, and time. She has normal strength. No cranial nerve deficit or sensory deficit.  Skin: Skin is warm and dry. No rash noted. She is not  diaphoretic. No erythema. No pallor.  Psychiatric: Her speech is normal and behavior is normal. Judgment and thought content normal. Her mood appears anxious. Cognition and memory are normal. She exhibits a depressed mood.  tearful   Results for orders placed or performed in visit on 01/31/15  POCT urinalysis dipstick  Result Value Ref Range   Color, UA yellow yellow   Clarity, UA cloudy (A) clear   Glucose, UA negative negative   Bilirubin, UA negative negative   Ketones, POC UA negative negative   Spec Grav, UA 1.010    Blood, UA moderate (A) negative   pH, UA 7.0    Protein Ur, POC negative negative   Urobilinogen, UA 0.2  Nitrite, UA Negative Negative   Leukocytes, UA Negative Negative   EKG: NSR; no acute ST changes    Assessment & Plan:   1. Other chest pain   2. Essential hypertension, benign   3. Generalized anxiety disorder   4. Screening for diabetes mellitus   5. Screening, lipid   6. Obesity    1. Chest pain: New/recurrent; atypical; non-exertional; usually occurs with anxiety while at rest; recommended labs but patient too anxious to have obtained at visit. 2.  HTN: uncontrolled; discussed associated comorbidities of uncontrolled blood pressure; pt refused medication but plans to lose weight, exercise, and avoid high salt diet.  RTC 3 months for recheck.  Obtain u/a; pt refused labs. 3.  Generalized anxiety disorder: worsening; with panic attacks; pt refused medication; highly recommended intensive psychotherapy.  RTC for acute worsening or in 3 months. 4.  Screening DMII: urine negative; pt refused labs. 6. Screening lipid: pt refused FLP. 7. Obesity: uncontrolled; highly recommend weight loss, exercise, low-calorie food choices.   Orders Placed This Encounter  Procedures  . CBC with Differential/Platelet    Standing Status: Future     Number of Occurrences:      Standing Expiration Date: 05/07/2015  . Lipid panel    Standing Status: Future     Number of  Occurrences:      Standing Expiration Date: 05/07/2015  . Hemoglobin A1c    Standing Status: Future     Number of Occurrences:      Standing Expiration Date: 05/07/2015  . COMPLETE METABOLIC PANEL WITH GFR    Standing Status: Future     Number of Occurrences:      Standing Expiration Date: 05/07/2015  . TSH    Standing Status: Future     Number of Occurrences:      Standing Expiration Date: 05/07/2015  . POCT urinalysis dipstick  . EKG 12-Lead   No orders of the defined types were placed in this encounter.    Return in about 3 months (around 05/03/2015) for recheck blood pressure.    Nathanyl Andujo Paulita FujitaMartin Amariz Flamenco, M.D. [facility]  [place] [address] [place], [place]  [id number] [phone] phone [phone] fax

## 2015-02-20 ENCOUNTER — Ambulatory Visit: Payer: BC Managed Care – PPO

## 2015-03-15 ENCOUNTER — Ambulatory Visit (INDEPENDENT_AMBULATORY_CARE_PROVIDER_SITE_OTHER): Payer: BC Managed Care – PPO | Admitting: Family Medicine

## 2015-03-15 VITALS — BP 142/88 | HR 100 | Temp 98.5°F | Resp 16 | Ht 68.75 in | Wt 244.0 lb

## 2015-03-15 DIAGNOSIS — R202 Paresthesia of skin: Secondary | ICD-10-CM | POA: Diagnosis not present

## 2015-03-15 DIAGNOSIS — L309 Dermatitis, unspecified: Secondary | ICD-10-CM | POA: Diagnosis not present

## 2015-03-15 DIAGNOSIS — M79602 Pain in left arm: Secondary | ICD-10-CM | POA: Diagnosis not present

## 2015-03-15 MED ORDER — CLOBETASOL PROPIONATE 0.05 % EX OINT: TOPICAL_OINTMENT | CUTANEOUS | Status: DC

## 2015-03-15 MED ORDER — DICLOFENAC SODIUM 75 MG PO TBEC: 75.0000 mg | DELAYED_RELEASE_TABLET | Freq: Two times a day (BID) | ORAL | Status: DC

## 2015-03-15 NOTE — Progress Notes (Signed)
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  She has no neck pain.  No meds tried  Also has concerns about elevated BP reading in October.  Working on weight loss.  Having some trouble with sleep.  Professor of History at [employer]  Objective:  NAD  120/90 BP 142/88 mmHg  Pulse 100  Temp(Src) 98.5 F (36.9 C) (Oral)  Resp 16  Ht 5' 8.75" (1.746 m)  Wt 244 lb (110.678 kg)  BMI 36.31 kg/m2  SpO2 98% Wt Readings from Last 3 Encounters:  03/15/15 244 lb (110.678 kg)  01/31/15 246 lb 3.2 oz (111.676 kg)  05/05/14 257 lb 4 oz (116.688 kg)    Neck ROM normal Reflexes:  Symmetric BJ and TJ Chest:  Clear Heart:  Reg, no murmur Left arm ROM and Neck ROM normal Pitting in nails  Assessment:  Mild periph neuropathy     ICD-9-CM ICD-10-CM   1. Paresthesias 782.0 R20.2 diclofenac (VOLTAREN) 75 MG EC tablet  2. Left arm pain 729.5 M79.602 diclofenac (VOLTAREN) 75 MG EC tablet  3. Eczema 692.9 L30.9 clobetasol ointment (TEMOVATE) 0.05 %     Signed, Elvina SidleKurt Janissa Bertram, MD

## 2015-05-05 ENCOUNTER — Ambulatory Visit: Payer: BC Managed Care – PPO | Admitting: Family Medicine

## 2015-05-05 ENCOUNTER — Encounter: Payer: Self-pay | Admitting: Family Medicine

## 2015-05-05 ENCOUNTER — Ambulatory Visit (INDEPENDENT_AMBULATORY_CARE_PROVIDER_SITE_OTHER): Payer: BC Managed Care – PPO | Admitting: Family Medicine

## 2015-05-05 VITALS — BP 159/95 | HR 109 | Temp 98.0°F | Resp 16 | Ht 68.75 in | Wt 242.0 lb

## 2015-05-05 DIAGNOSIS — F418 Other specified anxiety disorders: Secondary | ICD-10-CM

## 2015-05-05 DIAGNOSIS — F329 Major depressive disorder, single episode, unspecified: Secondary | ICD-10-CM

## 2015-05-05 DIAGNOSIS — I10 Essential (primary) hypertension: Secondary | ICD-10-CM | POA: Diagnosis not present

## 2015-05-05 DIAGNOSIS — Z803 Family history of malignant neoplasm of breast: Secondary | ICD-10-CM | POA: Diagnosis not present

## 2015-05-05 DIAGNOSIS — F419 Anxiety disorder, unspecified: Secondary | ICD-10-CM

## 2015-05-05 MED ORDER — AMLODIPINE BESYLATE 2.5 MG PO TABS: 2.5000 mg | ORAL_TABLET | Freq: Every day | ORAL | Status: DC

## 2015-05-05 NOTE — Progress Notes (Signed)
Subjective:    Patient ID: Amanda Haley, female    DOB: 1976-03-22, 40 y.o.   MRN: [id number]  05/05/2015  Follow-up and Hypertension   HPI This 40 y.o. female presents for three month follow-up of hypertension and anxiety:   1. HTN:  Weight down four pounds; refused medication and refused lab work at last visit. Has been testing BP at home; 120s/90s.  Then increased diastolic to 100s.   Really wants to avoid medication but does not want to cause damage.  Started beet powder this morning?  Fearful of natural supplement.    BP Readings from Last 3 Encounters:  05/05/15 159/95  03/15/15 142/88  01/31/15 143/107   2.  Generalized anxiety disorder: refused medication at last visit; psychotherapy was highly encouraged.  Not sleeping well recently.  Stress; work.  Does not sleep well most nights; was sleeping well before holidays.  Deadlines and new classes.  Working on balance again.   After last week, really stopped working excessively; started sleeping much better; started eating better.     3. L arm pain: s/p evaluation by Dr. Milus Glazier in 03/2015; diagnosed with nerve inflammation.  4.  Obesity:  Has lost 10 pounds by home scale.  [facility] scale shows four pound weight loss despite the holidays.    Wt Readings from Last 3 Encounters:  05/05/15 242 lb (109.77 kg)  03/15/15 244 lb (110.678 kg)  01/31/15 246 lb 3.2 oz (111.676 kg)   Review of Systems  Constitutional: Negative for fever, chills, diaphoresis and fatigue.  Eyes: Negative for visual disturbance.  Respiratory: Negative for cough and shortness of breath.   Cardiovascular: Negative for chest pain, palpitations and leg swelling.  Gastrointestinal: Negative for nausea, vomiting, abdominal pain, diarrhea and constipation.  Endocrine: Negative for cold intolerance, heat intolerance, polydipsia, polyphagia and polyuria.  Neurological: Negative for dizziness, tremors, seizures, syncope, facial asymmetry, speech difficulty, weakness,  light-headedness, numbness and headaches.    Past Medical History  Diagnosis Date  . Allergy   . Eczema   . Anemia   . Chronic headaches   . Back pain   . Vision changes    Past Surgical History  Procedure Laterality Date  . Dilation and curettage of uterus  2013  . Cervical polypectomy  2013   No Known Allergies Current Outpatient Prescriptions  Medication Sig Dispense Refill  . clobetasol ointment (TEMOVATE) 0.05 % APPLY TOPICALLY TWO TIMES DAILY 60 g 5   No current facility-administered medications for this visit.   Social History   Social History  . Marital Status: Single    Spouse Name: N/A  . Number of Children: N/A  . Years of Education: graduate   Occupational History  . researcher     [employer]   Social History Main Topics  . Smoking status: Never Smoker   . Smokeless tobacco: Never Used  . Alcohol Use: 0.0 oz/week    0 Standard drinks or equivalent per week     Comment: socially  . Drug Use: No  . Sexual Activity: Not on file   Other Topics Concern  . Not on file   Social History Narrative   Marital status: single; not dating.      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      Lives: alone; family in [place] and [place].  Family History  Problem Relation Age of Onset  . Esophageal cancer Father     and throat cancer  . Breast cancer Mother   . Colon cancer Maternal Grandfather   . Dementia Maternal Grandmother        Objective:    BP 159/95 mmHg  Pulse 109  Temp(Src) 98 F (36.7 C)  Resp 16  Ht 5' 8.75" (1.746 m)  Wt 242 lb (109.77 kg)  BMI 36.01 kg/m2  LMP 04/16/2015 Physical Exam  Constitutional: She is oriented to person, place, and time. She appears well-developed and well-nourished. No distress.  HENT:  Head: Normocephalic and atraumatic.  Right Ear: External ear normal.  Left Ear: External ear normal.   Nose: Nose normal.  Mouth/Throat: Oropharynx is clear and moist.  Eyes: Conjunctivae and EOM are normal. Pupils are equal, round, and reactive to light.  Neck: Normal range of motion. Neck supple. Carotid bruit is not present. No thyromegaly present.  Cardiovascular: Normal rate, regular rhythm, normal heart sounds and intact distal pulses.  Exam reveals no gallop and no friction rub.   No murmur heard. Pulmonary/Chest: Effort normal and breath sounds normal. She has no wheezes. She has no rales.  Abdominal: Soft. Bowel sounds are normal. She exhibits no distension and no mass. There is no tenderness. There is no rebound and no guarding.  Lymphadenopathy:    She has no cervical adenopathy.  Neurological: She is alert and oriented to person, place, and time. No cranial nerve deficit.  Skin: Skin is warm and dry. No rash noted. She is not diaphoretic. No erythema. No pallor.  Psychiatric: She has a normal mood and affect. Her behavior is normal.   Results for orders placed or performed in visit on 01/31/15  POCT urinalysis dipstick  Result Value Ref Range   Color, UA yellow yellow   Clarity, UA cloudy (A) clear   Glucose, UA negative negative   Bilirubin, UA negative negative   Ketones, POC UA negative negative   Spec Grav, UA 1.010    Blood, UA moderate (A) negative   pH, UA 7.0    Protein Ur, POC negative negative   Urobilinogen, UA 0.2    Nitrite, UA Negative Negative   Leukocytes, UA Negative Negative       Assessment & Plan:  No diagnosis found.  No orders of the defined types were placed in this encounter.   No orders of the defined types were placed in this encounter.    No Follow-up on file.    Treyvin Glidden Paulita Fujita, M.D. [facility] [address] [place], [place]  [address] [phone] phone [phone] fax

## 2015-05-05 NOTE — Patient Instructions (Signed)
Managing Your High Blood Pressure °Blood pressure is a measurement of how forceful your blood is pressing against the walls of the arteries. Arteries are muscular tubes within the circulatory system. Blood pressure does not stay the same. Blood pressure rises when you are active, excited, or nervous; and it lowers during sleep and relaxation. If the numbers measuring your blood pressure stay above normal most of the time, you are at risk for health problems. High blood pressure (hypertension) is a long-term (chronic) condition in which blood pressure is elevated. °A blood pressure reading is recorded as two numbers, such as 120 over 80 (or 120/80). The first, higher number is called the systolic pressure. It is a measure of the pressure in your arteries as the heart beats. The second, lower number is called the diastolic pressure. It is a measure of the pressure in your arteries as the heart relaxes between beats.  °Keeping your blood pressure in a normal range is important to your overall health and prevention of health problems, such as heart disease and stroke. When your blood pressure is uncontrolled, your heart has to work harder than normal. High blood pressure is a very common condition in adults because blood pressure tends to rise with age. Men and women are equally likely to have hypertension but at different times in life. Before age 45, men are more likely to have hypertension. After 39 years of age, women are more likely to have it. Hypertension is especially common in African Americans. This condition often has no signs or symptoms. The cause of the condition is usually not known. Your caregiver can help you come up with a plan to keep your blood pressure in a normal, healthy range. °BLOOD PRESSURE STAGES °Blood pressure is classified into four stages: normal, prehypertension, stage 1, and stage 2. Your blood pressure reading will be used to determine what type of treatment, if any, is necessary.  Appropriate treatment options are tied to these four stages:  °Normal °· Systolic pressure (mm Hg): below 120. °· Diastolic pressure (mm Hg): below 80. °Prehypertension °· Systolic pressure (mm Hg): 120 to 139. °· Diastolic pressure (mm Hg): 80 to 89. °Stage 1 °· Systolic pressure (mm Hg): 140 to 159. °· Diastolic pressure (mm Hg): 90 to 99. °Stage 2 °· Systolic pressure (mm Hg): 160 or above. °· Diastolic pressure (mm Hg): 100 or above. °RISKS RELATED TO HIGH BLOOD PRESSURE °Managing your blood pressure is an important responsibility. Uncontrolled high blood pressure can lead to: °· A heart attack. °· A stroke. °· A weakened blood vessel (aneurysm). °· Heart failure. °· Kidney damage. °· Eye damage. °· Metabolic syndrome. °· Memory and concentration problems. °HOW TO MANAGE YOUR BLOOD PRESSURE °Blood pressure can be managed effectively with lifestyle changes and medicines (if needed). Your caregiver will help you come up with a plan to bring your blood pressure within a normal range. Your plan should include the following: °Education °· Read all information provided by your caregivers about how to control blood pressure. °· Educate yourself on the latest guidelines and treatment recommendations. New research is always being done to further define the risks and treatments for high blood pressure. °Lifestyle changes °· Control your weight. °· Avoid smoking. °· Stay physically active. °· Reduce the amount of salt in your diet. °· Reduce stress. °· Control any chronic conditions, such as high cholesterol or diabetes. °· Reduce your alcohol intake. °Medicines °· Several medicines (antihypertensive medicines) are available, if needed, to bring blood pressure within a normal range. °  Communication °· Review all the medicines you take with your caregiver because there may be side effects or interactions. °· Talk with your caregiver about your diet, exercise habits, and other lifestyle factors that may be contributing to  high blood pressure. °· See your caregiver regularly. Your caregiver can help you create and adjust your plan for managing high blood pressure. °RECOMMENDATIONS FOR TREATMENT AND FOLLOW-UP  °The following recommendations are based on current guidelines for managing high blood pressure in nonpregnant adults. Use these recommendations to identify the proper follow-up period or treatment option based on your blood pressure reading. You can discuss these options with your caregiver. °· Systolic pressure of 120 to 139 or diastolic pressure of 80 to 89: Follow up with your caregiver as directed. °· Systolic pressure of 140 to 160 or diastolic pressure of 90 to 100: Follow up with your caregiver within 2 months. °· Systolic pressure above 160 or diastolic pressure above 100: Follow up with your caregiver within 1 month. °· Systolic pressure above 180 or diastolic pressure above 110: Consider antihypertensive therapy; follow up with your caregiver within 1 week. °· Systolic pressure above 200 or diastolic pressure above 120: Begin antihypertensive therapy; follow up with your caregiver within 1 week. °  °This information is not intended to replace advice given to you by your health care provider. Make sure you discuss any questions you have with your health care provider. °  °Document Released: 12/15/2011 Document Reviewed: 12/15/2011 °Elsevier Interactive Patient Education ©2016 Elsevier Inc. ° °

## 2015-06-05 ENCOUNTER — Other Ambulatory Visit: Payer: Self-pay

## 2015-06-30 ENCOUNTER — Ambulatory Visit (INDEPENDENT_AMBULATORY_CARE_PROVIDER_SITE_OTHER): Payer: BC Managed Care – PPO

## 2015-06-30 ENCOUNTER — Ambulatory Visit (INDEPENDENT_AMBULATORY_CARE_PROVIDER_SITE_OTHER): Payer: BC Managed Care – PPO | Admitting: Family Medicine

## 2015-06-30 VITALS — BP 120/80 | HR 91 | Temp 98.0°F | Resp 20 | Ht 68.75 in | Wt 240.2 lb

## 2015-06-30 DIAGNOSIS — M542 Cervicalgia: Secondary | ICD-10-CM | POA: Diagnosis not present

## 2015-06-30 DIAGNOSIS — I10 Essential (primary) hypertension: Secondary | ICD-10-CM

## 2015-06-30 DIAGNOSIS — G44219 Episodic tension-type headache, not intractable: Secondary | ICD-10-CM | POA: Diagnosis not present

## 2015-06-30 DIAGNOSIS — M79602 Pain in left arm: Secondary | ICD-10-CM

## 2015-06-30 MED ORDER — AMLODIPINE BESYLATE 5 MG PO TABS: 5.0000 mg | ORAL_TABLET | Freq: Every day | ORAL | Status: DC

## 2015-06-30 NOTE — Patient Instructions (Addendum)
     IF you received an x-ray today, you will receive an invoice from [facility]. Please contact [facility] at [phone] with questions or concerns regarding your invoice.   IF you received labwork today, you will receive an invoice from [facility]. Please contact [facility] at [phone] with questions or concerns regarding your invoice.   Our billing staff will not be able to assist you with questions regarding bills from these companies.  You will be contacted with the lab results as soon as they are available. The fastest way to get your results is to activate your My Chart account. Instructions are located on the last page of this paperwork. If you have not heard from us regarding the results in 2 weeks, please contact this office.     We recommend that you schedule a mammogram for breast cancer screening. Typically, you do not need a referral to do this. Please contact a local imaging center to schedule your mammogram.  [facility] - [phone]  *ask for the Radiology Department [facility]) - [phone] or [phone]  [facility] - [phone] [facility] - [phone] [facility] - [phone]  *ask for the Radiology Department [facility] - [phone]  *ask for the Radiology Department [facility] - [phone]  *ask for the Mammography Department [facility] - [phone]  

## 2015-06-30 NOTE — Progress Notes (Signed)
Subjective:    Patient ID: Amanda Haley, female    DOB: [birth year], 40 y.o.   MRN: [id number]  06/30/2015  Arm Pain; Shoulder Pain; and Hypertension   HPI This 40 y.o. female presents for evaluation of the following of L arm pain, headache, dizziness, nausea. Onset last night; assume not having AMI. Having L arm pain for several months.  Headache started yesterday but improved; hectic week and hectic week coming up; slept a lot over the weekend.  Cycle started today.  Due for mammogram; having chest and/or breast pain.  L shoulder is sore; when turning has pain.  S/p evaluation of L shoulder; L neck tightness and pressure.  Headache free now.  Symptoms interfered with sleep yet able to fall asleep.    HTN: Patient reports good compliance with medication, good tolerance to medication, and good symptom control.  Saw sister three weeks ago; used sister's BP cuff; home readings 120/90-100.  No apparent side effects to Amlodipine.     Review of Systems  Constitutional: Negative for fever, chills, diaphoresis and fatigue.  Eyes: Negative for visual disturbance.  Respiratory: Negative for cough and shortness of breath.   Cardiovascular: Negative for chest pain, palpitations and leg swelling.  Gastrointestinal: Negative for nausea, vomiting, abdominal pain, diarrhea and constipation.  Endocrine: Negative for cold intolerance, heat intolerance, polydipsia, polyphagia and polyuria.  Musculoskeletal: Positive for myalgias, neck pain and neck stiffness. Negative for back pain, joint swelling, arthralgias and gait problem.  Neurological: Positive for headaches. Negative for dizziness, tremors, seizures, syncope, facial asymmetry, speech difficulty, weakness, light-headedness and numbness.  Psychiatric/Behavioral: The patient is nervous/anxious.     Past Medical History  Diagnosis Date  . Allergy   . Eczema   . Anemia   . Chronic headaches   . Back pain   . Vision changes    Past Surgical  History  Procedure Laterality Date  . Dilation and curettage of uterus  2013  . Cervical polypectomy  2013   No Known Allergies  Social History   Social History  . Marital Status: Single    Spouse Name: N/A  . Number of Children: N/A  . Years of Education: graduate   Occupational History  . researcher     [employer]   Social History Main Topics  . Smoking status: Never Smoker   . Smokeless tobacco: Never Used  . Alcohol Use: 0.0 oz/week    0 Standard drinks or equivalent per week     Comment: socially  . Drug Use: No  . Sexual Activity: Not on file   Other Topics Concern  . Not on file   Social History Narrative   Marital status: single; not dating.      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      Lives: alone; family in [place] and [place].               Family History  Problem Relation Age of Onset  . Esophageal cancer Father     and throat cancer  . Breast cancer Mother 51    breast cancer  . Colon cancer Maternal Grandfather   . Dementia Maternal Grandmother        Objective:    BP 120/80 mmHg  Pulse 91  Temp(Src) 98 F (36.7 C) (Oral)  Resp 20  Ht 5' 8.75" (1.746  m)  Wt 240 lb 3.2 oz (108.954 kg)  BMI 35.74 kg/m2  SpO2 96%  LMP 06/30/2015 Physical Exam  Constitutional: She is oriented to person, place, and time. She appears well-developed and well-nourished. No distress.  HENT:  Head: Normocephalic and atraumatic.  Right Ear: External ear normal.  Left Ear: External ear normal.  Nose: Nose normal.  Mouth/Throat: Oropharynx is clear and moist.  Eyes: Conjunctivae and EOM are normal. Pupils are equal, round, and reactive to light.  Neck: Normal range of motion. Neck supple. Carotid bruit is not present. No thyromegaly present.  Cardiovascular: Normal rate, regular rhythm, normal heart sounds and intact distal pulses.  Exam reveals no  gallop and no friction rub.   No murmur heard. Pulmonary/Chest: Effort normal and breath sounds normal. She has no wheezes. She has no rales. She exhibits tenderness.    Abdominal: Soft. Bowel sounds are normal. She exhibits no distension and no mass. There is no tenderness. There is no rebound and no guarding.  Musculoskeletal:       Right shoulder: Normal.       Left shoulder: Normal. She exhibits normal range of motion, no tenderness, no bony tenderness, no pain, no spasm, normal pulse and normal strength.       Cervical back: Normal. She exhibits normal range of motion, no tenderness, no bony tenderness, no pain, no spasm and normal pulse.  Lymphadenopathy:    She has no cervical adenopathy.  Neurological: She is alert and oriented to person, place, and time. No cranial nerve deficit.  Skin: Skin is warm and dry. No rash noted. She is not diaphoretic. No erythema. No pallor.  Psychiatric: She has a normal mood and affect. Her behavior is normal.   Results for orders placed or performed in visit on 01/31/15  POCT urinalysis dipstick  Result Value Ref Range   Color, UA yellow yellow   Clarity, UA cloudy (A) clear   Glucose, UA negative negative   Bilirubin, UA negative negative   Ketones, POC UA negative negative   Spec Grav, UA 1.010    Blood, UA moderate (A) negative   pH, UA 7.0    Protein Ur, POC negative negative   Urobilinogen, UA 0.2    Nitrite, UA Negative Negative   Leukocytes, UA Negative Negative   No results found. EKG: NSR; no ST changes.    Assessment & Plan:   1. Essential hypertension, benign   2. Left arm pain   3. Neck pain   4. Episodic tension-type headache, not intractable     Orders Placed This Encounter  Procedures  . DG Cervical Spine Complete    Standing Status: Future     Number of Occurrences: 1     Standing Expiration Date: 06/29/2016    Order Specific Question:  Reason for Exam (SYMPTOM  OR DIAGNOSIS REQUIRED)    Answer:  L  upper arm  pain/sensation/numbness    Order Specific Question:  Is the patient pregnant?    Answer:  No    Order Specific Question:  Preferred imaging location?    Answer:  External  . DG Humerus Left    Standing Status: Future     Number of Occurrences: 1     Standing Expiration Date: 06/29/2016    Order Specific Question:  Reason for Exam (SYMPTOM  OR DIAGNOSIS REQUIRED)    Answer:  L  upper arm pain/sensation/numbness    Order Specific Question:  Is the patient pregnant?    Answer:  No    Order Specific Question:  Preferred imaging location?    Answer:  External  . EKG 12-Lead   Meds ordered this encounter  Medications  . amLODipine (NORVASC) 5 MG tablet    Sig: Take 1 tablet (5 mg total) by mouth daily.    Dispense:  30 tablet    Refill:  5    No Follow-up on file.    Feige Lowdermilk Paulita FujitaMartin Purl Claytor, M.D. [facility]  [place] [address] [place], [place]  [id number] [phone] phone [phone]) [phone] fax

## 2015-07-02 ENCOUNTER — Ambulatory Visit (INDEPENDENT_AMBULATORY_CARE_PROVIDER_SITE_OTHER): Payer: BC Managed Care – PPO | Admitting: Urgent Care

## 2015-07-02 ENCOUNTER — Ambulatory Visit (INDEPENDENT_AMBULATORY_CARE_PROVIDER_SITE_OTHER): Payer: BC Managed Care – PPO

## 2015-07-02 VITALS — BP 138/90 | HR 92 | Temp 98.8°F | Resp 16 | Ht 68.0 in | Wt 241.6 lb

## 2015-07-02 DIAGNOSIS — R1084 Generalized abdominal pain: Secondary | ICD-10-CM | POA: Diagnosis not present

## 2015-07-02 LAB — POCT CBC
Granulocyte percent: 67.9 %G (ref 37–80)
HCT, POC: 36.6 % — AB (ref 37.7–47.9)
HEMOGLOBIN: 12.8 g/dL (ref 12.2–16.2)
LYMPH, POC: 3 (ref 0.6–3.4)
MCH, POC: 31.3 pg — AB (ref 27–31.2)
MCHC: 34.9 g/dL (ref 31.8–35.4)
MCV: 89.6 fL (ref 80–97)
MID (cbc): 0.2 (ref 0–0.9)
MPV: 7.4 fL (ref 0–99.8)
PLATELET COUNT, POC: 276 10*3/uL (ref 142–424)
POC Granulocyte: 6.9 (ref 2–6.9)
POC LYMPH PERCENT: 29.9 %L (ref 10–50)
POC MID %: 2.2 %M (ref 0–12)
RBC: 4.09 M/uL (ref 4.04–5.48)
RDW, POC: 12.9 %
WBC: 10.1 10*3/uL (ref 4.6–10.2)

## 2015-07-02 LAB — POCT URINALYSIS DIP (MANUAL ENTRY)
BILIRUBIN UA: NEGATIVE
Bilirubin, UA: NEGATIVE
Glucose, UA: NEGATIVE
Nitrite, UA: NEGATIVE
PH UA: 5.5
PROTEIN UA: NEGATIVE
UROBILINOGEN UA: 0.2

## 2015-07-02 LAB — POC MICROSCOPIC URINALYSIS (UMFC): MUCUS RE: ABSENT

## 2015-07-02 LAB — POCT URINE PREGNANCY: Preg Test, Ur: NEGATIVE

## 2015-07-02 NOTE — Patient Instructions (Addendum)
Polyethylene glycol (Miralax), Docusate (Colace)  To help reduce constipation and promote bowel health: 1. Drink at least 64 ounces of water each day 2. Eat plenty of fiber (fruits, vegetables, whole grains, legumes) 3. Be physically active or exercise including walking, jogging, swimming, yoga, etc. 4. For active constipation use a stool softener (docusate) or an osmotic laxative (like Miralax) each day, or as needed   Please pick up Miralax for moderate to severe constipation. Take this once a day for the next 2-3 days. Please also start docusate stool softener, twice a day for at least 1 week. If stools become loose, cut down to once a day for ~1 week. If stools remain loose, cut back to 1 pill every other day for ~1 week. You can stop docusate thereafter and resume as needed for constipation.   Constipation, Adult Constipation is when a person has fewer than three bowel movements a week, has difficulty having a bowel movement, or has stools that are dry, hard, or larger than normal. As people grow older, constipation is more common. A low-fiber diet, not taking in enough fluids, and taking certain medicines may make constipation worse.  CAUSES   Certain medicines, such as antidepressants, pain medicine, iron supplements, antacids, and water pills.   Certain diseases, such as diabetes, irritable bowel syndrome (IBS), thyroid disease, or depression.   Not drinking enough water.   Not eating enough fiber-rich foods.   Stress or travel.   Lack of physical activity or exercise.   Ignoring the urge to have a bowel movement.   Using laxatives too much.  SIGNS AND SYMPTOMS   Having fewer than three bowel movements a week.   Straining to have a bowel movement.   Having stools that are hard, dry, or larger than normal.   Feeling full or bloated.   Pain in the lower abdomen.   Not feeling relief after having a bowel movement.  DIAGNOSIS  Your health care provider  will take a medical history and perform a physical exam. Further testing may be done for severe constipation. Some tests may include:  A barium enema X-ray to examine your rectum, colon, and, sometimes, your small intestine.   A sigmoidoscopy to examine your lower colon.   A colonoscopy to examine your entire colon. TREATMENT  Treatment will depend on the severity of your constipation and what is causing it. Some dietary treatments include drinking more fluids and eating more fiber-rich foods. Lifestyle treatments may include regular exercise. If these diet and lifestyle recommendations do not help, your health care provider may recommend taking over-the-counter laxative medicines to help you have bowel movements. Prescription medicines may be prescribed if over-the-counter medicines do not work.  HOME CARE INSTRUCTIONS   Eat foods that have a lot of fiber, such as fruits, vegetables, whole grains, and beans.  Limit foods high in fat and processed sugars, such as french fries, hamburgers, cookies, candies, and soda.   A fiber supplement may be added to your diet if you cannot get enough fiber from foods.   Drink enough fluids to keep your urine clear or pale yellow.   Exercise regularly or as directed by your health care provider.   Go to the restroom when you have the urge to go. Do not hold it.   Only take over-the-counter or prescription medicines as directed by your health care provider. Do not take other medicines for constipation without talking to your health care provider first.  SEEK IMMEDIATE MEDICAL CARE IF:  You have bright red blood in your stool.   Your constipation lasts for more than 4 days or gets worse.   You have abdominal or rectal pain.   You have thin, pencil-like stools.   You have unexplained weight loss. MAKE SURE YOU:   Understand these instructions.  Will watch your condition.  Will get help right away if you are not doing well or get  worse.   This information is not intended to replace advice given to you by your health care provider. Make sure you discuss any questions you have with your health care provider.   Document Released: 12/19/2003 Document Revised: 04/12/2014 Document Reviewed: 01/01/2013 Elsevier Interactive Patient Education 2016 [employer].    IF you received an x-ray today, you will receive an invoice from [facility]. Please contact [facility] at [phone] with questions or concerns regarding your invoice.   IF you received labwork today, you will receive an invoice from [employer]. Please contact [facility] at [phone] with questions or concerns regarding your invoice.   Our billing staff will not be able to assist you with questions regarding bills from these companies.  You will be contacted with the lab results as soon as they are available. The fastest way to get your results is to activate your My Chart account. Instructions are located on the last page of this paperwork. If you have not heard from [place] regarding the results in 2 weeks, please contact this office.

## 2015-07-02 NOTE — Progress Notes (Signed)
MRN: [id number] DOB: 25-Sep-1975  Subjective:   Amanda Haley is a 40 y.o. female presenting for chief complaint of Abdominal Pain and Flank Pain  Reports 1 day history of recurrent belly pain. She has had this problem before, has had abdominal CT (2015) which was normal. States that she had extensive work up with GI. She has previously tried laxative and stool softener intermittently. Denies bloody stools, diarrhea, dysuria, nausea, vomiting, fever, chest pain, shob, flank pain. She is not sexually active. Her cycle started 2 days ago and has generally been regular except for this cycle, she does have heavier bleeding. Admits that she cannot really differentiate her menstrual cramps from her current belly pain. She also admits longstanding history of constipation. She has bowel movements every other day, more or less. Denies straining, painful defecation. Does not actively try to eat healthily.  Amanda Haley has a current medication list which includes the following prescription(s): amlodipine and clobetasol ointment. Also has No Known Allergies.  Amanda Haley  has a past medical history of Allergy; Eczema; Anemia; Chronic headaches; Back pain; and Vision changes. Also  has past surgical history that includes Dilation and curettage of uterus (2013) and Cervical polypectomy (2013).  Objective:   Vitals: BP 138/90 mmHg  Pulse 92  Temp(Src) 98.8 F (37.1 C) (Oral)  Resp 16  Ht  (1.727 m)  Wt 241 lb 9.6 oz (109.589 kg)  BMI 36.74 kg/m2  SpO2 96%  LMP 06/30/2015  Physical Exam  Constitutional: She is oriented to person, place, and time. She appears well-developed and well-nourished.  Cardiovascular: Normal rate, regular rhythm and intact distal pulses.  Exam reveals no gallop and no friction rub.   No murmur heard. Pulmonary/Chest: No respiratory distress. She has no wheezes. She has no rales.  Abdominal: Soft. Bowel sounds are normal. She exhibits no distension and no mass. There is tenderness  (pain OUT OF PROPORTION to physical exam findings).    Neurological: She is alert and oriented to person, place, and time.  Skin: Skin is warm and dry.    Results for orders placed or performed in visit on 07/02/15 (from the past 24 hour(s))  POCT urinalysis dipstick     Status: Abnormal   Collection Time: 07/02/15  6:53 PM  Result Value Ref Range   Color, UA yellow yellow   Clarity, UA hazy (A) clear   Glucose, UA negative negative   Bilirubin, UA negative negative   Ketones, POC UA negative negative   Spec Grav, UA <=1.005    Blood, UA large (A) negative   pH, UA 5.5    Protein Ur, POC negative negative   Urobilinogen, UA 0.2    Nitrite, UA Negative Negative   Leukocytes, UA Trace (A) Negative  POCT Microscopic Urinalysis ([facility])     Status: Abnormal   Collection Time: 07/02/15  6:53 PM  Result Value Ref Range   WBC,UR,HPF,POC None None WBC/hpf   RBC,UR,HPF,POC Too numerous to count  (A) None RBC/hpf   Bacteria None None, Too numerous to count   Mucus Absent Absent   Epithelial Cells, UR Per Microscopy Few (A) None, Too numerous to count cells/hpf  POCT urine pregnancy     Status: None   Collection Time: 07/02/15  6:53 PM  Result Value Ref Range   Preg Test, Ur Negative Negative  POCT CBC     Status: Abnormal   Collection Time: 07/02/15  7:33 PM  Result Value Ref Range   WBC 10.1 4.6 - 10.2  K/uL   Lymph, poc 3.0 0.6 - 3.4   POC LYMPH PERCENT 29.9 10 - 50 %L   MID (cbc) 0.2 0 - 0.9   POC MID % 2.2 0 - 12 %M   POC Granulocyte 6.9 2 - 6.9   Granulocyte percent 67.9 37 - 80 %G   RBC 4.09 4.04 - 5.48 M/uL   Hemoglobin 12.8 12.2 - 16.2 g/dL   HCT, POC 16.136.6 (A) 09.637.7 - 47.9 %   MCV 89.6 80 - 97 fL   MCH, POC 31.3 (A) 27 - 31.2 pg   MCHC 34.9 31.8 - 35.4 g/dL   RDW, POC 04.512.9 %   Platelet Count, POC 276 142 - 424 K/uL   MPV 7.4 0 - 99.8 fL   Assessment and Plan :   1. Generalized abdominal pain - Labs pending. I reassured patient, will start constipation management.  Consider repeat CT abdomen if no improvement/worsening of symptoms.  Wallis BambergMario Myrtha Tonkovich, PA-C [facility] [phone] 07/02/2015 6:39 PM

## 2015-07-03 LAB — LIPID PANEL
Cholesterol: 182 mg/dL (ref 125–200)
HDL: 45 mg/dL — ABNORMAL LOW (ref >=46)
LDL CALC: 121 mg/dL (ref <130)
Total CHOL/HDL Ratio: 4 Ratio (ref <=5.0)
Triglycerides: 80 mg/dL (ref <150)
VLDL: 16 mg/dL (ref <30)

## 2015-07-03 LAB — HEMOGLOBIN A1C
Hgb A1c MFr Bld: 4.7 % (ref <5.7)
MEAN PLASMA GLUCOSE: 88 mg/dL

## 2015-07-03 LAB — COMPREHENSIVE METABOLIC PANEL
ALT: 10 U/L (ref 6–29)
AST: 15 U/L (ref 10–30)
Albumin: 4.6 g/dL (ref 3.6–5.1)
Alkaline Phosphatase: 57 U/L (ref 33–115)
BUN: 6 mg/dL — ABNORMAL LOW (ref 7–25)
CO2: 27 mmol/L (ref 20–31)
Calcium: 9.9 mg/dL (ref 8.6–10.2)
Chloride: 102 mmol/L (ref 98–110)
Creat: 0.9 mg/dL (ref 0.50–1.10)
GLUCOSE: 90 mg/dL (ref 65–99)
POTASSIUM: 4 mmol/L (ref 3.5–5.3)
Sodium: 139 mmol/L (ref 135–146)
Total Bilirubin: 1.3 mg/dL — ABNORMAL HIGH (ref 0.2–1.2)
Total Protein: 7.8 g/dL (ref 6.1–8.1)

## 2015-07-03 LAB — TSH: TSH: 1.45 mIU/L

## 2015-08-30 ENCOUNTER — Ambulatory Visit (INDEPENDENT_AMBULATORY_CARE_PROVIDER_SITE_OTHER): Payer: BC Managed Care – PPO | Admitting: Family Medicine

## 2015-08-30 VITALS — BP 128/80 | HR 93 | Temp 98.1°F | Resp 16 | Ht 70.0 in | Wt 241.0 lb

## 2015-08-30 DIAGNOSIS — J301 Allergic rhinitis due to pollen: Secondary | ICD-10-CM

## 2015-08-30 DIAGNOSIS — I10 Essential (primary) hypertension: Secondary | ICD-10-CM | POA: Diagnosis not present

## 2015-08-30 DIAGNOSIS — F411 Generalized anxiety disorder: Secondary | ICD-10-CM

## 2015-08-30 DIAGNOSIS — R42 Dizziness and giddiness: Secondary | ICD-10-CM | POA: Diagnosis not present

## 2015-08-30 DIAGNOSIS — D539 Nutritional anemia, unspecified: Secondary | ICD-10-CM | POA: Diagnosis not present

## 2015-08-30 LAB — POCT URINE PREGNANCY: PREG TEST UR: NEGATIVE

## 2015-08-30 LAB — POCT URINALYSIS DIP (MANUAL ENTRY)
BILIRUBIN UA: NEGATIVE
Blood, UA: NEGATIVE
Glucose, UA: NEGATIVE
Ketones, POC UA: NEGATIVE
LEUKOCYTES UA: NEGATIVE
NITRITE UA: NEGATIVE
PH UA: 7
Spec Grav, UA: 1.02
UROBILINOGEN UA: 1

## 2015-08-30 LAB — POC MICROSCOPIC URINALYSIS (UMFC)

## 2015-08-30 MED ORDER — AZELASTINE HCL 0.1 % NA SOLN: 2.0000 | Freq: Two times a day (BID) | NASAL | Status: DC

## 2015-08-30 MED ORDER — FLUTICASONE PROPIONATE 50 MCG/ACT NA SUSP: 2.0000 | Freq: Every day | NASAL | Status: DC

## 2015-08-30 NOTE — Progress Notes (Signed)
Subjective:    Patient ID: Amanda Haley, female    DOB: 1975-07-09, 40 y.o.   MRN: [id number]  08/30/2015  Dizziness   HPI This 40 y.o. female presents for evaluation of dizziness, neck tightness.  Onset chronically with recent worsening one week ago.  At a museum and felt really dizzy; wanted to sit down but kept walking; no falling.  Persisted throughout the week; felt off balance.  No spinning.  Will happen when changes line of vision; feels like head is spinning.  No shaking.turning head does not trigger dizziness. Rolling over in bed does not worsen.  Eyes feel tired.  Sleeping a lot in past week.  Hard to focus.  Slight headache.  Decreased appetite.  Just had eye exam; everything the same.  No blurred vision; no diplopia.  Intermittent tinnitus but not consistent.  When brushing teeth at night, has phlegm brown or yellow after brushing teeth. Some blood in oropharynx drainage; has occurred on two occasions. Nasal congestion mild; no rhinorrhea; +PND.  No sneezing. No sinus pressure.  No vomiting; mild nausea but chronic and intermittent.  Not eating as much; forced self to eat some; decreased appetite.  Did take Nyquil the other night because not sleeping well.  Slept well afterwards.  Concerned because traveling a lot this summer. Starting next week, for one week and flying.  Traveling by plane and alone.  No chest pain; arm squeezing a couple of times.  Sometimes will feel like brain is bleeding; has history of migraines; had more in 2002 with a week of headaches.  Prescribed Toradol in 2002 that resolved.  HTN: Patient reports good compliance with medication, good tolerance to medication, and good symptom control.    Anxiety:  Review of Systems  Constitutional: Positive for appetite change. Negative for fever, chills, diaphoresis and fatigue.  HENT: Positive for congestion, postnasal drip and tinnitus. Negative for ear pain, hearing loss, rhinorrhea, sinus pressure, sneezing, sore throat,  trouble swallowing and voice change.   Eyes: Negative for photophobia, pain, discharge, redness, itching and visual disturbance.  Respiratory: Negative for cough and shortness of breath.   Cardiovascular: Negative for chest pain, palpitations and leg swelling.  Gastrointestinal: Negative for nausea, vomiting, abdominal pain, diarrhea, constipation, blood in stool, anal bleeding and rectal pain.  Endocrine: Negative for cold intolerance, heat intolerance, polydipsia, polyphagia and polyuria.  Musculoskeletal: Positive for neck pain and neck stiffness.  Neurological: Positive for dizziness and headaches. Negative for tremors, seizures, syncope, facial asymmetry, speech difficulty, weakness, light-headedness and numbness.  Psychiatric/Behavioral: The patient is nervous/anxious.     Past Medical History  Diagnosis Date  . Allergy   . Eczema   . Anemia   . Chronic headaches   . Back pain   . Vision changes   . Hypertension    Past Surgical History  Procedure Laterality Date  . Dilation and curettage of uterus  2013  . Cervical polypectomy  2013   No Known Allergies  Social History   Social History  . Marital Status: Single    Spouse Name: N/A  . Number of Children: N/A  . Years of Education: graduate   Occupational History  . researcher     [employer]   Social History Main Topics  . Smoking status: Never Smoker   . Smokeless tobacco: Never Used  . Alcohol Use: 0.0 oz/week    0 Standard drinks or equivalent per week     Comment: socially  . Drug Use: No  .  Sexual Activity: Not on file   Other Topics Concern  . Not on file   Social History Narrative   Marital status: single; not dating.      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      Lives: alone; family in [place].               Family History  Problem Relation Age of Onset  . Esophageal  cancer Father     and throat cancer  . Breast cancer Mother [id number]    breast cancer  . Colon cancer Maternal Grandfather   . Dementia Maternal Grandmother        Objective:    BP 128/80 mmHg  Pulse 93  Temp(Src) 98.1 F (36.7 C) (Oral)  Resp 16  Ht 5\' 10"  (1.778 m)  Wt 241 lb (109.317 kg)  BMI 34.58 kg/m2  SpO2 98%  LMP 08/23/2015 (Approximate) Physical Exam  Constitutional: She is oriented to person, place, and time. She appears well-developed and well-nourished. No distress.  HENT:  Head: Normocephalic and atraumatic.  Right Ear: External ear normal.  Left Ear: External ear normal.  Nose: Nose normal.  Mouth/Throat: Oropharynx is clear and moist.  Eyes: Conjunctivae and EOM are normal. Pupils are equal, round, and reactive to light.  Neck: Normal range of motion. Neck supple. Carotid bruit is not present. No thyromegaly present.  Cardiovascular: Normal rate, regular rhythm, normal heart sounds and intact distal pulses.  Exam reveals no gallop and no friction rub.   No murmur heard. Pulmonary/Chest: Effort normal and breath sounds normal. She has no wheezes. She has no rales.  Abdominal: Soft. Bowel sounds are normal. She exhibits no distension and no mass. There is no tenderness. There is no rebound and no guarding.  Lymphadenopathy:    She has no cervical adenopathy.  Neurological: She is alert and oriented to person, place, and time. She has normal strength. No cranial nerve deficit. She exhibits normal muscle tone. She displays a negative Romberg sign. Coordination and gait normal.  Dix-Hallpike negative B.  Skin: Skin is warm and dry. No rash noted. She is not diaphoretic. No erythema. No pallor.  Psychiatric: She has a normal mood and affect. Her behavior is normal.   Results for orders placed or performed in visit on 08/30/15  POCT urinalysis dipstick  Result Value Ref Range   Color, UA yellow yellow   Clarity, UA clear clear   Glucose, UA negative negative    Bilirubin, UA negative negative   Ketones, POC UA negative negative   Spec Grav, UA 1.020    Blood, UA negative negative   pH, UA 7.0    Protein Ur, POC =30 (A) negative   Urobilinogen, UA 1.0    Nitrite, UA Negative Negative   Leukocytes, UA Negative Negative  POCT Microscopic Urinalysis ([facility])  Result Value Ref Range   WBC,UR,HPF,POC None None WBC/hpf   RBC,UR,HPF,POC None None RBC/hpf   Bacteria Few (A) None, Too numerous to count   Mucus Present (A) Absent   Epithelial Cells, UR Per Microscopy Few (A) None, Too numerous to count cells/hpf  POCT urine pregnancy  Result Value Ref Range   Preg Test, Ur Negative Negative   EKG: NSR; no arrhythmia.    Assessment & Plan:   1. Dizziness   2. Essential hypertension, benign  3. Generalized anxiety disorder   4. Macrocytic anemia   5. Seasonal allergic rhinitis due to pollen    -New. -Dizziness not suggestive of vertigo.  Pt refused labs but will return next week for labs.   -will treat allergic rhinitis symptoms to improve dizziness; rx for Astelin and Flonase provided. -continue current dose of Amlodipine.  Blood pressure stable. -dizziness also associated with fatigue, decreased appetite; may be due to acute viral syndrome.  Supportive care with rest, fluids. RTC for acute worsening.  Normal neurological exam in office. -Pt also admits to excessive stress; also chronic anxiety; stressors may be contributing to dizziness.     Orders Placed This Encounter  Procedures  . Comprehensive metabolic panel  . POCT urinalysis dipstick  . POCT Microscopic Urinalysis ([facility])  . POCT CBC  . POCT glucose (manual entry)  . POCT urine pregnancy  . EKG 12-Lead   Meds ordered this encounter  Medications  . fluticasone (FLONASE) 50 MCG/ACT nasal spray    Sig: Place 2 sprays into both nostrils daily.    Dispense:  16 g    Refill:  6  . azelastine (ASTELIN) 0.1 % nasal spray    Sig: Place 2 sprays into both nostrils 2 (two) times  daily. Use in each nostril as directed    Dispense:  30 mL    Refill:  12    No Follow-up on file.    Andrya Roppolo Paulita Fujita, M.D. [facility] [address] [place], [place]  [address] [phone] phone [phone] fax

## 2015-08-30 NOTE — Patient Instructions (Addendum)
 IF you received an x-ray today, you will receive an invoice from [facility]. Please contact [facility] at [phone] with questions or concerns regarding your invoice.   IF you received labwork today, you will receive an invoice from [facility]. Please contact [facility] at [phone] with questions or concerns regarding your invoice.   Our billing staff will not be able to assist you with questions regarding bills from these companies.  You will be contacted with the lab results as soon as they are available. The fastest way to get your results is to activate your My Chart account. Instructions are located on the last page of this paperwork. If you have not heard from us regarding the results in 2 weeks, please contact this office.  Dizziness Dizziness is a common problem. It is a feeling of unsteadiness or light-headedness. You may feel like you are about to faint. Dizziness can lead to injury if you stumble or fall. Anyone can become dizzy, but dizziness is more common in older adults. This condition can be caused by a number of things, including medicines, dehydration, or illness. HOME CARE INSTRUCTIONS Taking these steps may help with your condition: Eating and Drinking  Drink enough fluid to keep your urine clear or pale yellow. This helps to keep you from becoming dehydrated. Try to drink more clear fluids, such as water.  Do not drink alcohol.  Limit your caffeine intake if directed by your health care provider.  Limit your salt intake if directed by your health care provider. Activity  Avoid making quick movements.  Rise slowly from chairs and steady yourself until you feel okay.  In the morning, first sit up on the side of the bed. When you feel okay, stand slowly while you hold onto something until you know that your balance is fine.  Move your legs often if you need to stand in one place for a long time. Tighten and relax  your muscles in your legs while you are standing.  Do not drive or operate heavy machinery if you feel dizzy.  Avoid bending down if you feel dizzy. Place items in your home so that they are easy for you to reach without leaning over. Lifestyle  Do not use any tobacco products, including cigarettes, chewing tobacco, or electronic cigarettes. If you need help quitting, ask your health care provider.  Try to reduce your stress level, such as with yoga or meditation. Talk with your health care provider if you need help. General Instructions  Watch your dizziness for any changes.  Take medicines only as directed by your health care provider. Talk with your health care provider if you think that your dizziness is caused by a medicine that you are taking.  Tell a friend or a family member that you are feeling dizzy. If he or she notices any changes in your behavior, have this person call your health care provider.  Keep all follow-up visits as directed by your health care provider. This is important. SEEK MEDICAL CARE IF:  Your dizziness does not go away.  Your dizziness or light-headedness gets worse.  You feel nauseous.  You have reduced hearing.  You have new symptoms.  You are unsteady on your feet or you feel like the room is spinning. SEEK IMMEDIATE MEDICAL CARE IF:  You vomit or have diarrhea and are unable to eat or drink anything.  You have problems talking, walking, swallowing, or using your arms, hands, or legs.  You feel   generally weak.  You are not thinking clearly or you have trouble forming sentences. It may take a friend or family member to notice this.  You have chest pain, abdominal pain, shortness of breath, or sweating.  Your vision changes.  You notice any bleeding.  You have a headache.  You have neck pain or a stiff neck.  You have a fever.   This information is not intended to replace advice given to you by your health care provider. Make sure  you discuss any questions you have with your health care provider.   Document Released: 09/15/2000 Document Revised: 08/06/2014 Document Reviewed: 03/18/2014 Elsevier Interactive Patient Education 2016 Elsevier Inc.  

## 2015-09-02 ENCOUNTER — Ambulatory Visit (INDEPENDENT_AMBULATORY_CARE_PROVIDER_SITE_OTHER): Payer: BC Managed Care – PPO | Admitting: Family Medicine

## 2015-09-02 ENCOUNTER — Encounter: Payer: Self-pay | Admitting: Family Medicine

## 2015-09-02 VITALS — BP 134/92 | HR 80 | Temp 99.0°F | Resp 16 | Ht 68.0 in | Wt 240.0 lb

## 2015-09-02 DIAGNOSIS — J301 Allergic rhinitis due to pollen: Secondary | ICD-10-CM

## 2015-09-02 DIAGNOSIS — R42 Dizziness and giddiness: Secondary | ICD-10-CM | POA: Diagnosis not present

## 2015-09-02 DIAGNOSIS — I10 Essential (primary) hypertension: Secondary | ICD-10-CM | POA: Diagnosis not present

## 2015-09-02 DIAGNOSIS — J0121 Acute recurrent ethmoidal sinusitis: Secondary | ICD-10-CM

## 2015-09-02 LAB — COMPREHENSIVE METABOLIC PANEL
ALK PHOS: 63 U/L (ref 33–115)
ALT: 12 U/L (ref 6–29)
AST: 17 U/L (ref 10–30)
Albumin: 4.9 g/dL (ref 3.6–5.1)
BILIRUBIN TOTAL: 1.3 mg/dL — AB (ref 0.2–1.2)
BUN: 7 mg/dL (ref 7–25)
CO2: 26 mmol/L (ref 20–31)
Calcium: 9.9 mg/dL (ref 8.6–10.2)
Chloride: 98 mmol/L (ref 98–110)
Creat: 0.79 mg/dL (ref 0.50–1.10)
GLUCOSE: 73 mg/dL (ref 65–99)
Potassium: 4.2 mmol/L (ref 3.5–5.3)
Sodium: 137 mmol/L (ref 135–146)
Total Protein: 8 g/dL (ref 6.1–8.1)

## 2015-09-02 LAB — VITAMIN B12: Vitamin B-12: 420 pg/mL (ref 200–1100)

## 2015-09-02 LAB — TSH: TSH: 1.09 mIU/L

## 2015-09-02 MED ORDER — AMOXICILLIN 500 MG PO TABS: 1000.0000 mg | ORAL_TABLET | Freq: Two times a day (BID) | ORAL | Status: DC

## 2015-09-02 NOTE — Patient Instructions (Addendum)
 IF you received an x-ray today, you will receive an invoice from [facility]. Please contact [facility] at [phone] with questions or concerns regarding your invoice.   IF you received labwork today, you will receive an invoice from [facility]. Please contact [facility] at [phone] with questions or concerns regarding your invoice.   Our billing staff will not be able to assist you with questions regarding bills from these companies.  You will be contacted with the lab results as soon as they are available. The fastest way to get your results is to activate your My Chart account. Instructions are located on the last page of this paperwork. If you have not heard from us regarding the results in 2 weeks, please contact this office.  Dizziness Dizziness is a common problem. It is a feeling of unsteadiness or light-headedness. You may feel like you are about to faint. Dizziness can lead to injury if you stumble or fall. Anyone can become dizzy, but dizziness is more common in older adults. This condition can be caused by a number of things, including medicines, dehydration, or illness. HOME CARE INSTRUCTIONS Taking these steps may help with your condition: Eating and Drinking  Drink enough fluid to keep your urine clear or pale yellow. This helps to keep you from becoming dehydrated. Try to drink more clear fluids, such as water.  Do not drink alcohol.  Limit your caffeine intake if directed by your health care provider.  Limit your salt intake if directed by your health care provider. Activity  Avoid making quick movements.  Rise slowly from chairs and steady yourself until you feel okay.  In the morning, first sit up on the side of the bed. When you feel okay, stand slowly while you hold onto something until you know that your balance is fine.  Move your legs often if you need to stand in one place for a long time. Tighten and relax  your muscles in your legs while you are standing.  Do not drive or operate heavy machinery if you feel dizzy.  Avoid bending down if you feel dizzy. Place items in your home so that they are easy for you to reach without leaning over. Lifestyle  Do not use any tobacco products, including cigarettes, chewing tobacco, or electronic cigarettes. If you need help quitting, ask your health care provider.  Try to reduce your stress level, such as with yoga or meditation. Talk with your health care provider if you need help. General Instructions  Watch your dizziness for any changes.  Take medicines only as directed by your health care provider. Talk with your health care provider if you think that your dizziness is caused by a medicine that you are taking.  Tell a friend or a family member that you are feeling dizzy. If he or she notices any changes in your behavior, have this person call your health care provider.  Keep all follow-up visits as directed by your health care provider. This is important. SEEK MEDICAL CARE IF:  Your dizziness does not go away.  Your dizziness or light-headedness gets worse.  You feel nauseous.  You have reduced hearing.  You have new symptoms.  You are unsteady on your feet or you feel like the room is spinning. SEEK IMMEDIATE MEDICAL CARE IF:  You vomit or have diarrhea and are unable to eat or drink anything.  You have problems talking, walking, swallowing, or using your arms, hands, or legs.  You feel   generally weak.  You are not thinking clearly or you have trouble forming sentences. It may take a friend or family member to notice this.  You have chest pain, abdominal pain, shortness of breath, or sweating.  Your vision changes.  You notice any bleeding.  You have a headache.  You have neck pain or a stiff neck.  You have a fever.   This information is not intended to replace advice given to you by your health care provider. Make sure  you discuss any questions you have with your health care provider.   Document Released: 09/15/2000 Document Revised: 08/06/2014 Document Reviewed: 03/18/2014 Elsevier Interactive Patient Education 2016 Elsevier Inc.  

## 2015-09-02 NOTE — Progress Notes (Signed)
Subjective:    Patient ID: Amanda Haley, female    DOB: 01/13/1976, 40 y.o.   MRN: [id number]  09/02/2015  Hypertension   HPI This 40 y.o. female presents for 72 hour follow-up of dizziness.  Dizziness has improved some since last visit.  A little less dizzy; did not sleep well last night.  Exhausted; has never been this tired.  Feels better if outside.  If returns home, crashes.  L side was numb this morning at 5:00am; similar symptoms in the past; not sure if sleeps in same spot.  Resolved by time finally awoke.  Headache today but mild.  Brown-yellow phlegm when brushing teeth.  More prone to needing to spit; using Astelin and Flonase.  Dizziness 25-50% improved.  No sinus pressure but is having posterior occipital pressure.  Eating very healthy this past week.  Known seasonal allergies.  Also eczema.  Never has suffered with sinusitis.  Father and sister with sinus issues.  +PND. Does not feel like full cold symptoms.  Having abdominal issues but has appointment with GYN.  Not sure if connected; ;chronic L sided pain; pelvic pain.  Lower back pain.     Review of Systems  Constitutional: Positive for fatigue. Negative for fever, chills and diaphoresis.  HENT: Positive for postnasal drip. Negative for congestion, ear pain, sinus pressure, sore throat, trouble swallowing and voice change.   Eyes: Negative for visual disturbance.  Respiratory: Negative for cough and shortness of breath.   Cardiovascular: Negative for chest pain, palpitations and leg swelling.  Gastrointestinal: Negative for nausea, vomiting, abdominal pain, diarrhea and constipation.  Endocrine: Negative for cold intolerance, heat intolerance, polydipsia, polyphagia and polyuria.  Neurological: Positive for dizziness, numbness and headaches. Negative for tremors, seizures, syncope, facial asymmetry, speech difficulty, weakness and light-headedness.    Past Medical History  Diagnosis Date  . Allergy   . Eczema   . Anemia     . Chronic headaches   . Back pain   . Vision changes   . Hypertension    Past Surgical History  Procedure Laterality Date  . Dilation and curettage of uterus  2013  . Cervical polypectomy  2013   No Known Allergies  Social History   Social History  . Marital Status: Single    Spouse Name: N/A  . Number of Children: N/A  . Years of Education: graduate   Occupational History  . researcher     [employer]   Social History Main Topics  . Smoking status: Never Smoker   . Smokeless tobacco: Never Used  . Alcohol Use: 0.0 oz/week    0 Standard drinks or equivalent per week     Comment: socially  . Drug Use: No  . Sexual Activity: Not on file   Other Topics Concern  . Not on file   Social History Narrative   Marital status: single; not dating.      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      Lives: alone; family in [place] and [place].               Family History  Problem Relation Age of Onset  . Esophageal cancer Father     and throat cancer  . Breast cancer Mother 79    breast cancer  . Colon cancer Maternal Grandfather   . Dementia Maternal Grandmother  Objective:    BP 134/92 mmHg  Pulse 80  Temp(Src) 99 F (37.2 C) (Oral)  Resp 16  Ht 5\' 8"  (1.727 m)  Wt 240 lb (108.863 kg)  BMI 36.50 kg/m2  LMP 08/23/2015 (Approximate) Physical Exam  Constitutional: She is oriented to person, place, and time. She appears well-developed and well-nourished. No distress.  HENT:  Head: Normocephalic and atraumatic.  Right Ear: External ear normal.  Left Ear: External ear normal.  Nose: Nose normal.  Mouth/Throat: Oropharynx is clear and moist.  Eyes: Conjunctivae and EOM are normal. Pupils are equal, round, and reactive to light.  Neck: Normal range of motion. Neck supple. Carotid bruit is not present. No thyromegaly present.  Cardiovascular: Normal  rate, regular rhythm, normal heart sounds and intact distal pulses.  Exam reveals no gallop and no friction rub.   No murmur heard. Pulmonary/Chest: Effort normal and breath sounds normal. She has no wheezes. She has no rales.  Abdominal: Soft. Bowel sounds are normal. She exhibits no distension and no mass. There is no tenderness. There is no rebound and no guarding.  Lymphadenopathy:    She has no cervical adenopathy.  Neurological: She is alert and oriented to person, place, and time. No cranial nerve deficit. She exhibits normal muscle tone. Coordination normal.  Skin: Skin is warm and dry. No rash noted. She is not diaphoretic. No erythema. No pallor.  Psychiatric: She has a normal mood and affect. Her behavior is normal. Judgment and thought content normal.        Assessment & Plan:   1. Dizziness   2. Essential hypertension, benign   3. Allergic rhinitis due to pollen   4. Acute recurrent ethmoidal sinusitis     Orders Placed This Encounter  Procedures  . CBC with Differential/Platelet  . Comprehensive metabolic panel  . TSH  . Vitamin B12  . VITAMIN D 25 Hydroxy (Vit-D Deficiency, Fractures)  . Sedimentation rate   Meds ordered this encounter  Medications  . amoxicillin (AMOXIL) 500 MG tablet    Sig: Take 2 tablets (1,000 mg total) by mouth 2 (two) times daily.    Dispense:  40 tablet    Refill:  0    No Follow-up on file.    Amanda Haley Paulita FujitaMartin Zariah Jost, M.D. [facility]  [place] [address] [place], [place]  [id number] [phone] phone [phone] fax

## 2015-09-03 LAB — CBC WITH DIFFERENTIAL/PLATELET
BASOS ABS: 0 {cells}/uL (ref 0–200)
BASOS PCT: 0 %
Eosinophils Absolute: 87 cells/uL (ref 15–500)
Eosinophils Relative: 1 %
HCT: 42.6 % (ref 35.0–45.0)
Hemoglobin: 13.7 g/dL (ref 11.7–15.5)
LYMPHS PCT: 27 %
Lymphs Abs: 2349 cells/uL (ref 850–3900)
MCH: 29.1 pg (ref 27.0–33.0)
MCHC: 32.2 g/dL (ref 32.0–36.0)
MCV: 90.6 fL (ref 80.0–100.0)
MONOS PCT: 4 %
MPV: 10.3 fL (ref 7.5–12.5)
Monocytes Absolute: 348 cells/uL (ref 200–950)
NEUTROS PCT: 68 %
Neutro Abs: 5916 cells/uL (ref 1500–7800)
PLATELETS: 301 10*3/uL (ref 140–400)
RBC: 4.7 MIL/uL (ref 3.80–5.10)
RDW: 12.9 % (ref 11.0–15.0)
WBC: 8.7 10*3/uL (ref 3.8–10.8)

## 2015-09-03 LAB — SEDIMENTATION RATE: SED RATE: 4 mm/h (ref 0–20)

## 2015-09-03 LAB — VITAMIN D 25 HYDROXY (VIT D DEFICIENCY, FRACTURES): Vit D, 25-Hydroxy: 28 ng/mL — ABNORMAL LOW (ref 30–100)

## 2015-09-05 ENCOUNTER — Telehealth: Payer: Self-pay | Admitting: *Deleted

## 2015-09-05 NOTE — Telephone Encounter (Signed)
Patient called waiting to know her lab results.  I advised patient that they had not been reviewed yet but I would send a message to the doctor.  Patient understood.

## 2015-09-17 ENCOUNTER — Encounter: Payer: Self-pay | Admitting: Family Medicine

## 2015-09-22 NOTE — Telephone Encounter (Signed)
Mychart message sent to patient.

## 2015-12-27 ENCOUNTER — Encounter: Payer: Self-pay | Admitting: Family Medicine

## 2015-12-27 ENCOUNTER — Ambulatory Visit (INDEPENDENT_AMBULATORY_CARE_PROVIDER_SITE_OTHER): Payer: BC Managed Care – PPO | Admitting: Family Medicine

## 2015-12-27 VITALS — BP 114/84 | HR 94 | Temp 98.3°F | Resp 17 | Ht 68.5 in | Wt 243.0 lb

## 2015-12-27 DIAGNOSIS — I10 Essential (primary) hypertension: Secondary | ICD-10-CM | POA: Diagnosis not present

## 2015-12-27 DIAGNOSIS — E669 Obesity, unspecified: Secondary | ICD-10-CM

## 2015-12-27 DIAGNOSIS — G5603 Carpal tunnel syndrome, bilateral upper limbs: Secondary | ICD-10-CM | POA: Diagnosis not present

## 2015-12-27 DIAGNOSIS — J301 Allergic rhinitis due to pollen: Secondary | ICD-10-CM | POA: Diagnosis not present

## 2015-12-27 DIAGNOSIS — G4700 Insomnia, unspecified: Secondary | ICD-10-CM

## 2015-12-27 DIAGNOSIS — M940 Chondrocostal junction syndrome [Tietze]: Secondary | ICD-10-CM

## 2015-12-27 DIAGNOSIS — R0683 Snoring: Secondary | ICD-10-CM | POA: Diagnosis not present

## 2015-12-27 DIAGNOSIS — F411 Generalized anxiety disorder: Secondary | ICD-10-CM | POA: Diagnosis not present

## 2015-12-27 MED ORDER — AMLODIPINE BESYLATE 2.5 MG PO TABS: 7.5000 mg | ORAL_TABLET | Freq: Every day | ORAL | 5 refills | Status: DC

## 2015-12-27 NOTE — Patient Instructions (Addendum)
1. Please start allergy nasal spray daily to treat seasonal allergies. If your headaches and sinus congestion does not improve in the next five days, please start Amoxicillin for a sinus infection. 2.  Please purchase wrist splints at the drug store and wear at bedtime every night to help with carpal tunnel syndrome; also start a B complex supplement daily to help with nerve inflammation.  If the tingling in your palms does not improve in the upcoming month, please contact the office for a referral to a specialist. 3.  You should be contacted in the upcoming two weeks with an appointment with a sleep specialist. 4.  Please increase Amlodipine to 7.5mg  daily.   IF you received an x-ray today, you will receive an invoice from [facility]. Please contact [facility] at [phone] with questions or concerns regarding your invoice.   IF you received labwork today, you will receive an invoice from [facility]. Please contact [facility] at [phone] with questions or concerns regarding your invoice.   Our billing staff will not be able to assist you with questions regarding bills from these companies.  You will be contacted with the lab results as soon as they are available. The fastest way to get your results is to activate your My Chart account. Instructions are located on the last page of this paperwork. If you have not heard from [place] regarding the results in 2 weeks, please contact this office.      Carpal Tunnel Syndrome Carpal tunnel syndrome is a condition that causes pain in your hand and arm. The carpal tunnel is a narrow area located on the palm side of your wrist. Repeated wrist motion or certain diseases may cause swelling within the tunnel. This swelling pinches the main nerve in the wrist (median nerve). CAUSES  This condition may be caused by:   Repeated wrist motions.  Wrist injuries.  Arthritis.  A cyst or tumor in the carpal  tunnel.  Fluid buildup during pregnancy. Sometimes the cause of this condition is not known.  RISK FACTORS This condition is more likely to develop in:   People who have jobs that cause them to repeatedly move their wrists in the same motion, such as butchers and cashiers.  Women.  People with certain conditions, such as:  Diabetes.  Obesity.  An underactive thyroid (hypothyroidism).  Kidney failure. SYMPTOMS  Symptoms of this condition include:   A tingling feeling in your fingers, especially in your thumb, index, and middle fingers.  Tingling or numbness in your hand.  An aching feeling in your entire arm, especially when your wrist and elbow are bent for long periods of time.  Wrist pain that goes up your arm to your shoulder.  Pain that goes down into your palm or fingers.  A weak feeling in your hands. You may have trouble grabbing and holding items. Your symptoms may feel worse during the night.  DIAGNOSIS  This condition is diagnosed with a medical history and physical exam. You may also have tests, including:   An electromyogram (EMG). This test measures electrical signals sent by your nerves into the muscles.  X-rays. TREATMENT  Treatment for this condition includes:  Lifestyle changes. It is important to stop doing or modify the activity that caused your condition.  Physical or occupational therapy.  Medicines for pain and inflammation. This may include medicine that is injected into your wrist.  A wrist splint.  Surgery. HOME CARE INSTRUCTIONS  If You Have a Splint:  Wear it as told by your health care provider. Remove it only as told by your health care provider.  Loosen the splint if your fingers become numb and tingle, or if they turn cold and blue.  Keep the splint clean and dry. General Instructions  Take over-the-counter and prescription medicines only as told by your health care provider.  Rest your wrist from any activity that may be  causing your pain. If your condition is work related, talk to your employer about changes that can be made, such as getting a wrist pad to use while typing.  If directed, apply ice to the painful area:  Put ice in a plastic bag.  Place a towel between your skin and the bag.  Leave the ice on for 20 minutes, 2-3 times per day.  Keep all follow-up visits as told by your health care provider. This is important.  Do any exercises as told by your health care provider, physical therapist, or occupational therapist. SEEK MEDICAL CARE IF:   You have new symptoms.  Your pain is not controlled with medicines.  Your symptoms get worse.   This information is not intended to replace advice given to you by your health care provider. Make sure you discuss any questions you have with your health care provider.   Document Released: 03/19/2000 Document Revised: 12/11/2014 Document Reviewed: 08/07/2014 Elsevier Interactive Patient Education [employer]2016 Elsevier Inc.

## 2015-12-27 NOTE — Progress Notes (Signed)
By signing my name below, I, [name], attest that this documentation has been prepared under the direction and in the presence of Amanda Forts, MD.  Electronically Signed: [name], Medical Scribe. 12/27/15. 9:57 AM.  Subjective:    Patient ID: Amanda Haley, female    DOB: 04-14-1975, 40 y.o.   MRN: [id number]   12/27/2015  ARM and Hand numbness in left side   HPI  HPI Comments: Amanda Haley is a 40 y.o. female who presents to the [facility] complaining of numbness and tingling in her arms L>R after she wakes up onset this week. Pt reports associated symptoms of chronic neck pain, neck stiffness, hearing popping noises, HA R>L, and currently feeling tingling in her hands. Pt states her arms feel heavy when she gets up and it takes a while for her arms to feel nl after awakening, and she'll occasionally have a tingling feeling in her palm through the day, but it's worse at night. Pt stopped sleeping with her head on her arm, she had been sleeping with he arms to her side, and she tries to sleep in a way that avoids her arms folding under her. 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 Pt states when she was younger her hands would swell for no reason.  Bruises: Pt reports seeing blood, and brown and yellow sputum when she brushes her teeth 6 days out of the 7 days this week, but doesn't see blood when she flosses. Pt states she has always bruised easily and states she gets them on her back. Pt's bruises will be tender to the touch. Pt denies dental pain, nose bleeds, bloody stools, and hematuria.  Sleep: Pt mentions she's not sleeping well. Pt is seeing a therapist and they recommended taking melatonin. Pt was told she snores mildly. She desires a sleep study.  Anxiety: Pt reports associated symptoms of SOB, palpitations, "pinching" CP, neck pain, neck stiffness, and "low grade" HA, R>L, that feels like pressure behind her eyes. Pt reports experiencing  palpitations with a pulse of 101 from the vitals from her phone app after a presentation she had and had a "full blown HA". Pt states when she sat down afterward her presentation she had difficulty taking a deep breath. Pt states when she woke up after the goodies powder she still had a residual HA. Pt took goodies powder and nasal spray for her symptoms without relief to her symptoms. Pt reports having dark urine. Pt's menses have been light one month and the next month they would be heavy.  Patient is concerned with blood pressure reading of 130/80 today; she would like reading lower.   Depression screen [facility] 2/9 12/27/2015 09/02/2015 08/30/2015 07/02/2015 06/30/2015  Decreased Interest 0 0 0 0 0  Down, Depressed, Hopeless 0 0 0 0 0  PHQ - 2 Score 0 0 0 0 0  Altered sleeping - - - - -  Tired, decreased energy - - - - -  Change in appetite - - - - -  Feeling bad or failure about yourself  - - - - -  Trouble concentrating - - - - -  Moving slowly or fidgety/restless - - - - -  Suicidal thoughts - - - - -  PHQ-9 Score - - - - -   Review of Systems  Constitutional: Negative for chills, diaphoresis, fatigue and fever.  HENT: Positive for postnasal drip, rhinorrhea and sinus pressure. Negative for congestion, dental problem,  sneezing, sore throat, trouble swallowing and voice change.   Eyes: Positive for pain (pressure behind eyes). Negative for visual disturbance.  Respiratory: Positive for shortness of breath. Negative for cough.   Cardiovascular: Positive for chest pain and palpitations. Negative for leg swelling.  Gastrointestinal: Negative for abdominal pain, constipation, diarrhea, nausea and vomiting.  Endocrine: Negative for cold intolerance, heat intolerance, polydipsia, polyphagia and polyuria.  Genitourinary: Negative for hematuria and menstrual problem.  Musculoskeletal: Positive for neck pain and neck stiffness.  Allergic/Immunologic: Positive for environmental allergies.  Neurological:  Positive for numbness and headaches. Negative for dizziness, tremors, seizures, syncope, facial asymmetry, speech difficulty, weakness and light-headedness.  Hematological: Bruises/bleeds easily.  Psychiatric/Behavioral: Positive for sleep disturbance. The patient is nervous/anxious.    Past Medical History:  Diagnosis Date  . Allergy   . Anemia   . Back pain   . Chronic headaches   . Eczema   . Hypertension   . Vision changes    Past Surgical History:  Procedure Laterality Date  . CERVICAL POLYPECTOMY  2013  . DILATION AND CURETTAGE OF UTERUS  2013   No Known Allergies Current Outpatient Prescriptions  Medication Sig Dispense Refill  . amLODipine (NORVASC) 2.5 MG tablet Take 3 tablets (7.5 mg total) by mouth daily. 90 tablet 5  . clobetasol ointment (TEMOVATE) 0.05 % APPLY TOPICALLY TWO TIMES DAILY 60 g 5   No current facility-administered medications for this visit.    Social History   Social History  . Marital status: Single    Spouse name: N/A  . Number of children: N/A  . Years of education: graduate   Occupational History  . researcher [employer]   Social History Main Topics  . Smoking status: Never Smoker  . Smokeless tobacco: Never Used  . Alcohol use 0.0 oz/week     Comment: socially  . Drug use: No  . Sexual activity: Not on file   Other Topics Concern  . Not on file   Social History Narrative   Marital status: single; not dating.      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      Lives: alone; family in [place] and [place].               Family History  Problem Relation Age of Onset  . Esophageal cancer Father     and throat cancer  . Breast cancer Mother 61    breast cancer  . Colon cancer Maternal Grandfather   . Dementia Maternal Grandmother    Objective:    BP 114/84 (BP Location: Left Arm, Cuff Size: Large)   Pulse  94   Temp 98.3 F (36.8 C) (Oral)   Resp 17   Ht 5' 8.5" (1.74 m)   Wt 243 lb (110.2 kg)   SpO2 100%   BMI 36.41 kg/m  Physical Exam  Constitutional: She is oriented to person, place, and time. She appears well-developed and well-nourished. No distress.  HENT:  Head: Normocephalic and atraumatic.  Right Ear: External ear normal.  Left Ear: External ear normal.  Nose: Nose normal.  Mouth/Throat: Posterior oropharyngeal edema present.  Eyes: Conjunctivae and EOM are normal. Pupils are equal, round, and reactive to light.  Neck: Normal range of motion. Neck supple. Carotid bruit is not present. No thyromegaly present.  Cardiovascular:  Normal rate, regular rhythm, normal heart sounds and intact distal pulses.  Exam reveals no gallop and no friction rub.   No murmur heard. Pulmonary/Chest: Effort normal and breath sounds normal. No respiratory distress. She has no wheezes. She has no rales.  Abdominal: Soft. Bowel sounds are normal. She exhibits no distension and no mass. There is no tenderness. There is no rebound and no guarding.  Musculoskeletal:       Right shoulder: Normal.       Left shoulder: Normal.       Right wrist: Normal.       Left wrist: Normal.       Cervical back: Normal.       Lumbar back: Normal.  Tinel and Phalen's negative.  Lymphadenopathy:    She has no cervical adenopathy.  Neurological: She is alert and oriented to person, place, and time. No cranial nerve deficit.  Skin: Skin is warm and dry. No rash noted. She is not diaphoretic. No erythema. No pallor.  Psychiatric: She has a normal mood and affect. Her behavior is normal.  Nursing note and vitals reviewed.  Results for orders placed or performed in visit on 09/02/15  CBC with Differential/Platelet  Result Value Ref Range   WBC 8.7 3.8 - 10.8 K/uL   RBC 4.70 3.80 - 5.10 MIL/uL   Hemoglobin 13.7 11.7 - 15.5 g/dL   HCT 41.2 62.1 - 99.9 %   MCV 90.6 80.0 - 100.0 fL   MCH 29.1 27.0 - 33.0 pg   MCHC  32.2 32.0 - 36.0 g/dL   RDW 47.8 29.7 - 65.0 %   Platelets 301 140 - 400 K/uL   MPV 10.3 7.5 - 12.5 fL   Neutro Abs 5,916 1,500 - 7,800 cells/uL   Lymphs Abs 2,349 850 - 3,900 cells/uL   Monocytes Absolute 348 200 - 950 cells/uL   Eosinophils Absolute 87 15 - 500 cells/uL   Basophils Absolute 0 0 - 200 cells/uL   Neutrophils Relative % 68 %   Lymphocytes Relative 27 %   Monocytes Relative 4 %   Eosinophils Relative 1 %   Basophils Relative 0 %   Smear Review Criteria for review not met   Comprehensive metabolic panel  Result Value Ref Range   Sodium 137 135 - 146 mmol/L   Potassium 4.2 3.5 - 5.3 mmol/L   Chloride 98 98 - 110 mmol/L   CO2 26 20 - 31 mmol/L   Glucose, Bld 73 65 - 99 mg/dL   BUN 7 7 - 25 mg/dL   Creat 2.28 6.14 - 3.01 mg/dL   Total Bilirubin 1.3 (H) 0.2 - 1.2 mg/dL   Alkaline Phosphatase 63 33 - 115 U/L   AST 17 10 - 30 U/L   ALT 12 6 - 29 U/L   Total Protein 8.0 6.1 - 8.1 g/dL   Albumin 4.9 3.6 - 5.1 g/dL   Calcium 9.9 8.6 - 56.3 mg/dL  TSH  Result Value Ref Range   TSH 1.09 mIU/L  Vitamin B12  Result Value Ref Range   Vitamin B-12 420 200 - 1,100 pg/mL  VITAMIN D 25 Hydroxy (Vit-D Deficiency, Fractures)  Result Value Ref Range   Vit D, 25-Hydroxy 28 (L) 30 - 100 ng/mL  Sedimentation rate  Result Value Ref Range   Sed Rate 4 0 - 20 mm/hr   BP sitting up: left arm: 114/84 Right arm: 114/84  Assessment & Plan:   1. Bilateral carpal tunnel syndrome   2.  Seasonal allergic rhinitis due to pollen   3. Essential hypertension, benign   4. Generalized anxiety disorder   5. Snoring   6. Insomnia   7. Costochondritis   8. Obesity    -New B carpal tunnel syndrome; recommend nighttime wrist splints qhs; also recommend B complex. -refer for sleep study considering HTN, obesity, insomnia. -start nasal steroid spray daily; if no improvement in allergy symptoms in five days, start Amoxicillin. -increase Amlodipine to 7.36m daily with goal diastolic readings  in the 70s. -recommend trial of Melatonin qhs; continue psychotherapy for anxiety. -chronic costochondritis without recent worsening; suffered with minimal tachycardia at end of stressful day; heart rate in the 80s today.  No further work up warranted at this time.   Orders Placed This Encounter  Procedures  . Ambulatory referral to Sleep Studies    Referral Priority:   Routine    Referral Type:   Consultation    Referral Reason:   Specialty Services Required    Number of Visits Requested:   1   Meds ordered this encounter  Medications  . amLODipine (NORVASC) 2.5 MG tablet    Sig: Take 3 tablets (7.5 mg total) by mouth daily.    Dispense:  90 tablet    Refill:  5    Return in about 3 months (around 03/27/2016) for recheck.  I personally performed the services described in this documentation, which was scribed in my presence. The recorded information has been reviewed and considered.  Mailyn Steichen MElayne Guerin M.D. [facility] [place]  [phone]

## 2016-01-22 ENCOUNTER — Encounter: Payer: Self-pay | Admitting: Neurology

## 2016-01-22 ENCOUNTER — Ambulatory Visit (INDEPENDENT_AMBULATORY_CARE_PROVIDER_SITE_OTHER): Payer: BC Managed Care – PPO | Admitting: Neurology

## 2016-01-22 VITALS — BP 130/86 | HR 88 | Resp 20 | Ht 69.0 in | Wt 248.0 lb

## 2016-01-22 DIAGNOSIS — F5102 Adjustment insomnia: Secondary | ICD-10-CM

## 2016-01-22 DIAGNOSIS — R0683 Snoring: Secondary | ICD-10-CM

## 2016-01-22 NOTE — Progress Notes (Signed)
Order for ONO faxed to [facility]. Received a receipt of confirmation.

## 2016-01-22 NOTE — Progress Notes (Signed)
SLEEP MEDICINE CLINIC   Provider:  Melvyn Novasarmen  Zayda Angell, M D  Referring Provider: Ethelda ChickSmith, Kristi M, MD Primary Care Physician:  Nilda SimmerSMITH,KRISTI, MD  Chief Complaint  Patient presents with  . New Patient (Initial Visit)    trouble staying asleep at night, never sleep study    HPI:  Amanda LeedsDwana Runell Haley is a 40 y.o. female , seen here as a referral from Dr. Katrinka BlazingSmith for a sleep evaluation,  Chief complaint according to patient : "I have a hard time falling asleep, feeling tired in the daytime, not sleeping enough"  Mrs. Runell Haley reports that she developed her sleep habits during her teenage years, growing up in a house with the TV was a constant companion. She fell asleep with the TV in the bedroom, when she was younger she would sleep through the night as a teenager but she may not have had enough hours alocated to sleep. As a teenager she went to bed around 10:30 PM now it is much later but she still has to rise early around 7 AM, and she still has the TV habit of watching her go to sleep. She also reports that her chest drop has been much more stressful and that it is hard for her to switch her mind off to find relaxation and to allow herself to go to sleep. She tends to worry, she has to meet deadlines, which put enormous pressure on her. She often spends 12 hours at the workplace, feeling that she has to satisfy 2 jobs being one person. 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   Sleep habits are as follows: Average bedtimes around midnight, but she has trouble falling asleep and it may take her 30 minutes to an hour to initiate sleep. She may also not stay asleep, waking up at night once but sometimes more. This week has been good for her was only 1 arousal at night, if there is a lot of stress on her she may wake up at 2, at 4 and 6 AM  and she notices that it is her work problems or concerns that she transmits into her sleep. She incorporates a lot of her school  situations into her dreams. 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  Some dreams are nightmarish.  She wakes without headaches and has no dry mouth . Her arm will sometimes wake up with a numb arm, the left- she was checked for a carpal tunnel. She has been told she snores, not severely.   Sleep medical history and family sleep history: Sleep habits have been established since teenage. HTN , in her 30s.  Family history of apnea,  father may have had it , once used CPAP.    Social history: single, no children. Non smoker, non ETOH , rarely, 1 drink a week or less.  Caffeine use; AM one cup, no sodas, no iced tea.    Review of Systems: Out of a complete 14 system review, the patient complains of only the following symptoms, and all other reviewed systems are negative. Exausted, not ever rested, not enough sleep.   Epworth score 5 , Fatigue severity score 40 , depression score 4/15    Social History   Social History  . Marital status: Single    Spouse name: N/A  . Number of children: N/A  . 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 Smoking status: Never Smoker  . Smokeless tobacco: Never Used  . Alcohol use 0.0 oz/week     Comment: socially  . Drug use: No  . Sexual activity: Not on file   Other Topics Concern  . Not on file   Social History Narrative   Marital status: single; not dating.      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      Lives: alone; family in [place] and [place].                Family History  Problem Relation Age of Onset  . Esophageal cancer Father     and throat cancer  . Breast cancer Mother 75    breast cancer  . Colon cancer Maternal Grandfather   . Dementia Maternal Grandmother     Past Medical History:  Diagnosis Date  .  Allergy   . Anemia   . Back pain   . Chronic headaches   . Eczema   . Hypertension   . Vision changes     Past Surgical History:  Procedure Laterality Date  . CERVICAL POLYPECTOMY  2013  . DILATION AND CURETTAGE OF UTERUS  2013    Current Outpatient Prescriptions  Medication Sig Dispense Refill  . amLODipine (NORVASC) 2.5 MG tablet Take 3 tablets (7.5 mg total) by mouth daily. 90 tablet 5  . clobetasol ointment (TEMOVATE) 0.05 % APPLY TOPICALLY TWO TIMES DAILY 60 g 5   No current facility-administered medications for this visit.     Allergies as of 01/22/2016  . (No Known Allergies)    Vitals: BP 130/86   Pulse 88   Resp 20   Ht 5\' 9"  (1.753 m)   Wt 248 lb (112.5 kg)   BMI 36.62 kg/m  Last Weight:  Wt Readings from Last 1 Encounters:  01/22/16 248 lb (112.5 kg)   ZOX:WRUE mass index is 36.62 kg/m.     Last Height:   Ht Readings from Last 1 Encounters:  01/22/16 5\' 9"  (1.753 m)    Physical exam:  General: The patient is awake, alert and appears not in acute distress. The patient is well groomed. Head: Normocephalic, atraumatic. Neck is supple. Mallampati  3  neck circumference: 15.25 . Nasal airflow patent , TMJ is  evident . Retrognathia is seen.  Cardiovascular:  Regular rate and rhythm, without  murmurs or carotid bruit, and without distended neck veins. Respiratory: Lungs are clear to auscultation. Skin:  Without evidence of edema, or rash Trunk: BMI is elevated  The patient's posture is erect   Neurologic exam : The patient is awake and alert, oriented to place and time.    Attention span & concentration ability appears normal.  Speech is fluent,  without  dysarthria, dysphonia or aphasia.  Mood and affect are appropriate.  Cranial nerves: Pupils are equal and briskly reactive to light.  Funduscopic exam without  evidence of pallor or edema. Extraocular movements  in vertical and horizontal planes intact and without nystagmus. Visual fields by finger  perimetry are intact. Hearing to finger rub intact. Facial sensation intact to fine touch. Facial motor strength is symmetric and tongue and uvula move midline. Shoulder shrug was symmetrical.  Motor exam:   Normal tone, muscle bulk and symmetric strength in all extremities. Sensory:  Fine touch, pinprick and vibration were normal . Proprioception tested in the upper extremities was normal. Coordination: Rapid alternating movements in the fingers/hands was normal. Finger-to-nose maneuver  normal without evidence of ataxia, dysmetria or tremor.  Gait and station: Patient walks without assistive device and is able unassisted to climb up to the exam table. Strength within normal limits.  Stance is stable and normal.   Deep tendon reflexes: in the  upper and lower extremities are symmetric and intact. Babinski maneuver response is downgoing.  The patient was advised of the nature of the diagnosed sleep disorder , the treatment options and risks for general a health and wellness arising from not treating the condition.  I spent more than 45 minutes of face to face time with the patient. Greater than 50% of time was spent in counseling and coordination of care. We have discussed the diagnosis and differential and I answered the patient's questions.     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 External stressors make it harder for her to get sleep but feels restorative and refreshing. Sometimes she will take a daytime nap but this is not always possible for her. She does report more a feeling of fatigue than actual  sleepiness. I will address some sleep hygiene rules with her, including her TV habit. I would like her to reduce caffeine to the morning hours, hydrate with water, if possible take a nap 20-30 minutes , walk or swim about an hour or 2 before she comes home to rest. I would like her to create a routine ritual that helps her to wind down and to accept sleepiness. Melatonin can help to sustain sleep but it does normally not help to initiate sleep. Melatonin helps best if the patient is a shift worker or jet lag, because the initiation of sleep is easy but the maintenance of sleep is difficult for those patients. It can help to extend a sleep time from 3 hours 245 but it will not help to advance bedtime. I like also for her to take vitamin B12 in the morning, I recommended an over-the-counter vitamin D and a fish oil, best a fish on made of Krill.  2) She will undergo a ONO to screen for snoring and suspected apnea. If positive , I will ask her to return for a SPLIT. Marland Kitchen    Plan:  Treatment plan and additional workup :  ONO, if positive - get a split. Rv after ONO.      Porfirio Mylar Ivy Meriwether MD  01/22/2016   CC: Ethelda Chick, Md [address] [place], [place] [address]

## 2016-01-22 NOTE — Patient Instructions (Signed)

## 2016-02-17 ENCOUNTER — Encounter: Payer: Self-pay | Admitting: Family Medicine

## 2016-02-17 ENCOUNTER — Ambulatory Visit (INDEPENDENT_AMBULATORY_CARE_PROVIDER_SITE_OTHER): Payer: BC Managed Care – PPO | Admitting: Family Medicine

## 2016-02-17 ENCOUNTER — Ambulatory Visit (INDEPENDENT_AMBULATORY_CARE_PROVIDER_SITE_OTHER): Payer: BC Managed Care – PPO

## 2016-02-17 VITALS — BP 128/88 | HR 98 | Temp 99.0°F | Resp 18 | Ht 68.5 in | Wt 242.6 lb

## 2016-02-17 DIAGNOSIS — J301 Allergic rhinitis due to pollen: Secondary | ICD-10-CM | POA: Diagnosis not present

## 2016-02-17 DIAGNOSIS — I10 Essential (primary) hypertension: Secondary | ICD-10-CM | POA: Diagnosis not present

## 2016-02-17 DIAGNOSIS — R0789 Other chest pain: Secondary | ICD-10-CM

## 2016-02-17 DIAGNOSIS — F411 Generalized anxiety disorder: Secondary | ICD-10-CM | POA: Diagnosis not present

## 2016-02-17 DIAGNOSIS — S239XXA Sprain of unspecified parts of thorax, initial encounter: Secondary | ICD-10-CM | POA: Diagnosis not present

## 2016-02-17 DIAGNOSIS — R042 Hemoptysis: Secondary | ICD-10-CM

## 2016-02-17 MED ORDER — AMOXICILLIN 400 MG/5ML PO SUSR: 1000.0000 mg | Freq: Two times a day (BID) | ORAL | 0 refills | Status: DC

## 2016-02-17 MED ORDER — AMOXICILLIN 500 MG PO TABS: 1000.0000 mg | ORAL_TABLET | Freq: Two times a day (BID) | ORAL | 0 refills | Status: DC

## 2016-02-17 MED ORDER — AMLODIPINE BESYLATE 2.5 MG PO TABS: 10.0000 mg | ORAL_TABLET | Freq: Every day | ORAL | 5 refills | Status: DC

## 2016-02-17 NOTE — Progress Notes (Signed)
Subjective:    Patient ID: Amanda Haley, female    DOB: 1975/11/18, 40 y.o.   MRN: [id number]  02/17/2016  Coughing Up Blood (for the past few weeks. Has noticed blood mixed with phlegm. No coughing. )   HPI This 40 y.o. female presents for evaluation of coughing up blood for the past weeks.  Did not sleep well last night, felt like tasted blood when going back to bed.  Upon awakening, started brushing teeth noticed blood.  Has not gone away at all. Sometimes mixed with mucous and sometimes not.  Tasted blood in back of mouth. Went to dentist yesterday; thought might be dental related blood.  Did fall which is likely unrelated; fell face down; knees really swollen.  Then three mights ago, had horrible back in upper back.  Shift to L and R upper back.  Really painful all day after fall.  Did not take anything for back pain but felt better the following day.  Had upper shoulder blade pain; onset 9/10 and improved to 1-2/10.  Yesterday, back pain improved. Also had chest pain with fall; when pressing on chest over the past few days, felt tenderness in anterior chest.  No improvement in the past few days; still having tenderness in anterior chest.  Right now, back pain has improved. Called for appointment this morning.  Called for an appointment but got anxious.  Before brushing teeth, noticed blood. Has a lot of nasal congestion; no sinus pressure; feels congestion sitting in back of throat; feels compelled to spit; so spit up mucous from throat this morning; blood in mucous; there was just some red blood; what seemed different, this morning more red than mucous.  Good check up yesterday from dentist. No fever/chills/sweats.No coughing at all.  Clears throat.  Nose sprays prescribed earlier in year for chronic rhinitis.  Usually thick mucous with red tinge.  Worried about chest puncture or injury with fall.  Worried about radiation to fall in past.  Not taking nasal sprays currently for chronic nasal  congestion.  Did not take BP medication or any other medications.  Also taking B and D vitamin.  Father died esophageal cancer; history of throat cancer but drank and smoke.  No history of tobacco; rare alcohol.  No Gi symptoms.  No Tb risks.  Has suffered with heart palpitations.  Not occurring very often.  Has increased Amlodipine to 7.5mg .   BP running 120s/80s highs.  Sister has a cuff BP.   Has not used both nasal sprays since this summer.  Astelin is more effective than Flonase.  Not pregnant; current menses.   Wt Readings from Last 3 Encounters:  02/17/16 242 lb 9.6 oz (110 kg)  01/22/16 248 lb (112.5 kg)  12/27/15 243 lb (110.2 kg)   BP Readings from Last 3 Encounters:  02/17/16 128/88  01/22/16 130/86  12/27/15 114/84    Review of Systems  Constitutional: Negative for chills, diaphoresis, fatigue and fever.  HENT: Positive for congestion and postnasal drip. Negative for dental problem, ear discharge, ear pain, facial swelling, hearing loss, mouth sores, nosebleeds, sinus pain, sinus pressure, sneezing, sore throat, tinnitus, trouble swallowing and voice change.   Respiratory: Negative for cough, choking, chest tightness, shortness of breath, wheezing and stridor.   Cardiovascular: Positive for chest pain. Negative for palpitations and leg swelling.  Gastrointestinal: Negative for abdominal pain, diarrhea, nausea and vomiting.  Musculoskeletal: Positive for back pain and myalgias. Negative for gait problem, joint swelling, neck pain and neck stiffness.  Neurological: Negative for dizziness, tremors, seizures, syncope, facial asymmetry, weakness, light-headedness, numbness and headaches.  Psychiatric/Behavioral: The patient is nervous/anxious.     Past Medical History:  Diagnosis Date  . Allergy   . Anemia   . Back pain   . Chronic headaches   . Eczema   . Hypertension   . Vision changes    Past Surgical History:  Procedure Laterality Date  . CERVICAL POLYPECTOMY  2013  .  DILATION AND CURETTAGE OF UTERUS  2013   No Known Allergies  Social History   Social History  . Marital status: Single    Spouse name: N/A  . Number of children: N/A  . Years of education: graduate   Occupational History  . researcher [employer]   Social History Main Topics  . Smoking status: Never Smoker  . Smokeless tobacco: Never Used  . Alcohol use 0.0 oz/week     Comment: socially  . Drug use: No  . Sexual activity: Not on file   Other Topics Concern  . Not on file   Social History Narrative   Marital status: single; not dating.      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      Lives: alone; family in [place] and [place].               Family History  Problem Relation Age of Onset  . Esophageal cancer Father     and throat cancer  . Breast cancer Mother 4042    breast cancer  . Colon cancer Maternal Grandfather   . Dementia Maternal Grandmother        Objective:    BP 128/88   Pulse 98   Temp 99 F (37.2 C) (Oral)   Resp 18   Ht 5' 8.5" (1.74 m)   Wt 242 lb 9.6 oz (110 kg)   LMP 02/12/2016   SpO2 98%   BMI 36.35 kg/m  Physical Exam  Constitutional: She is oriented to person, place, and time. She appears well-developed and well-nourished. No distress.  HENT:  Head: Normocephalic and atraumatic.  Right Ear: Tympanic membrane, external ear and ear canal normal.  Left Ear: Tympanic membrane, external ear and ear canal normal.  Nose: Mucosal edema and rhinorrhea present. No nose lacerations or sinus tenderness. Right sinus exhibits no maxillary sinus tenderness and no frontal sinus tenderness. Left sinus exhibits no maxillary sinus tenderness and no frontal sinus tenderness.  Mouth/Throat: Uvula is midline, oropharynx is clear and moist and mucous membranes are normal. No oropharyngeal exudate.  Mild bleeding from tooth R upper  gumline.  No surrounding erythema or fluctuance.  Eyes: Conjunctivae and EOM are normal. Pupils are equal, round, and reactive to light.  Neck: Normal range of motion. Neck supple. Carotid bruit is not present. No thyromegaly present.  Cardiovascular: Normal rate, regular rhythm, normal heart sounds and intact distal pulses.  Exam reveals no gallop and no friction rub.   No murmur heard. Pulmonary/Chest: Effort normal and breath sounds normal. She has no wheezes. She has no rales. She exhibits tenderness.  Abdominal: Soft. Bowel sounds are normal. She exhibits no distension and no mass. There is no tenderness. There is no rebound and no guarding.  Musculoskeletal:       Cervical back: Normal. She exhibits normal range of  motion, no tenderness and no bony tenderness.       Thoracic back: Normal. She exhibits normal range of motion, no tenderness and no bony tenderness.  Lymphadenopathy:    She has no cervical adenopathy.  Neurological: She is alert and oriented to person, place, and time. No cranial nerve deficit.  Skin: Skin is warm and dry. No rash noted. She is not diaphoretic. No erythema. No pallor.  Psychiatric: She has a normal mood and affect. Her behavior is normal.   Dg Chest 2 View  Result Date: 02/17/2016 CLINICAL DATA:  Hemoptysis for the past 2 weeks. Patient denies cough. Nonsmoker. EXAM: CHEST  2 VIEW COMPARISON:  None in PACs FINDINGS: The lungs are borderline hyperinflated. There is no focal infiltrate. There is no pleural effusion. The heart and pulmonary vascularity are normal. The mediastinum is normal in width. The bony thorax exhibits no acute abnormality. IMPRESSION: Borderline hyperinflation may be voluntary or may reflect bronchitic changes. There is no pneumonia, pulmonary parenchymal mass, or other acute cardiopulmonary abnormality observed. Electronically Signed   By: David  SwazilandJordan M.D.   On: 02/17/2016 11:28        Assessment & Plan:   1. Acute seasonal allergic  rhinitis due to pollen   2. Hemoptysis   3. Essential hypertension, benign   4. Generalized anxiety disorder   5. Atypical chest pain   6. Thoracic back sprain, initial encounter    -presenting with blood tinged post-nasal drainage every morning upon awakening.  Obtain CXR to rule out infiltrate, mass, etc.  Treat with Flonase and Astelin; refer to ENT for laryngoscopy.  Treat for acute sinusitis. -increase Amlodipine to 10mg  daily with goal diastolic readings in 70s.  Recommend exercise and weight loss. -recent fall with anterior chest wall strain and thoracic back strain.  Full range of motion with no pain today. Supportive care with rest, stretching.   Orders Placed This Encounter  Procedures  . DG Chest 2 View    Standing Status:   Future    Number of Occurrences:   1    Standing Expiration Date:   02/16/2017    Order Specific Question:   Reason for Exam (SYMPTOM  OR DIAGNOSIS REQUIRED)    Answer:   hemoptysis    Order Specific Question:   Is the patient pregnant?    Answer:   No    Order Specific Question:   Preferred imaging location?    Answer:   External  . Ambulatory referral to ENT    Referral Priority:   Routine    Referral Type:   Consultation    Referral Reason:   Specialty Services Required    Requested Specialty:   Otolaryngology    Number of Visits Requested:   1   Meds ordered this encounter  Medications  . DISCONTD: amoxicillin (AMOXIL) 500 MG tablet    Sig: Take 2 tablets (1,000 mg total) by mouth 2 (two) times daily.    Dispense:  40 tablet    Refill:  0  . amLODipine (NORVASC) 2.5 MG tablet    Sig: Take 4 tablets (10 mg total) by mouth daily.    Dispense:  120 tablet    Refill:  5  . amoxicillin (AMOXIL) 400 MG/5ML suspension    Sig: Take 12.5 mLs (1,000 mg total) by mouth 2 (two) times daily.    Dispense:  250 mL    Refill:  0    No Follow-up on file.   Lonzy Mato Paulita FujitaMartin Vikki Gains, M.D.  [facility] [address] [place], [place]  [address] [phone] phone [phone] fax

## 2016-02-17 NOTE — Patient Instructions (Addendum)
1.  Please restart taking nasal sprays. 2. Call in ten days if you are still seeing blood.    IF you received an x-ray today, you will receive an invoice from [facility]. Please contact [facility] at [phone] with questions or concerns regarding your invoice.   IF you received labwork today, you will receive an invoice from [facility]. Please contact [facility] at [phone] with questions or concerns regarding your invoice.   Our billing staff will not be able to assist you with questions regarding bills from these companies.  You will be contacted with the lab results as soon as they are available. The fastest way to get your results is to activate your My Chart account. Instructions are located on the last page of this paperwork. If you have not heard from us regarding the results in 2 weeks, please contact this office.

## 2016-02-18 ENCOUNTER — Telehealth: Payer: Self-pay

## 2016-02-18 NOTE — Telephone Encounter (Signed)
Patient is calling in regards about her X-Ray results. Patient stated that Dr. Katrinka BlazingSmith is supposed to call her once she has went over the results.  Please advise   [phone]

## 2016-02-19 NOTE — Telephone Encounter (Signed)
Chest xray is normal. No evidence of pneumonia, mass, or other abnormality  These are the notes written by Dr. Katrinka BlazingSmith for Chest xray. Left message for patient to call us back regarding her results.

## 2016-02-23 ENCOUNTER — Telehealth: Payer: Self-pay

## 2016-02-23 NOTE — Telephone Encounter (Signed)
OV notes for ENT referral are not complete. OV was 11/14. Thank you!

## 2016-02-25 NOTE — Telephone Encounter (Signed)
Called pt to see if she had ever gotten results. I do not see documentation anywhere that she had. Pt stated that she had CB the next day and someone gave her the results.

## 2016-03-05 NOTE — Telephone Encounter (Signed)
Note completed 

## 2016-03-08 ENCOUNTER — Telehealth: Payer: Self-pay

## 2016-03-08 MED ORDER — AMLODIPINE BESYLATE 10 MG PO TABS: 10.0000 mg | ORAL_TABLET | Freq: Every day | ORAL | 1 refills | Status: DC

## 2016-03-08 NOTE — Telephone Encounter (Signed)
Received fax from pharm that ins won't cover four of the 2.5 mg tabs of amlodipine. Dr Katrinka BlazingSmith increased her dose to 10 mg QD at last OV. Called pt who reported that she is fine with taking one of the 10 mg tabs that ins will cover, and pharm has given OK to crush if needed. Sent in new Rx for the 10 mg tabs to replace four of the 2.5 mg.

## 2016-04-19 ENCOUNTER — Other Ambulatory Visit: Payer: Self-pay | Admitting: Family Medicine

## 2016-04-19 DIAGNOSIS — L309 Dermatitis, unspecified: Secondary | ICD-10-CM

## 2016-05-04 ENCOUNTER — Ambulatory Visit (INDEPENDENT_AMBULATORY_CARE_PROVIDER_SITE_OTHER): Payer: BC Managed Care – PPO | Admitting: Family Medicine

## 2016-05-04 ENCOUNTER — Encounter: Payer: Self-pay | Admitting: Family Medicine

## 2016-05-04 VITALS — BP 132/88 | HR 104 | Temp 98.6°F | Resp 18 | Ht 68.5 in | Wt 250.0 lb

## 2016-05-04 DIAGNOSIS — S139XXA Sprain of joints and ligaments of unspecified parts of neck, initial encounter: Secondary | ICD-10-CM | POA: Diagnosis not present

## 2016-05-04 DIAGNOSIS — M940 Chondrocostal junction syndrome [Tietze]: Secondary | ICD-10-CM | POA: Diagnosis not present

## 2016-05-04 DIAGNOSIS — I10 Essential (primary) hypertension: Secondary | ICD-10-CM

## 2016-05-04 DIAGNOSIS — J301 Allergic rhinitis due to pollen: Secondary | ICD-10-CM

## 2016-05-04 DIAGNOSIS — F411 Generalized anxiety disorder: Secondary | ICD-10-CM | POA: Diagnosis not present

## 2016-05-04 DIAGNOSIS — M791 Myalgia, unspecified site: Secondary | ICD-10-CM

## 2016-05-04 NOTE — Patient Instructions (Addendum)
Check blood pressure once daily with new machine; call if blood pressure > 140/90.   IF you received an x-ray today, you will receive an invoice from [facility]. Please contact [facility] at [phone] with questions or concerns regarding your invoice.   IF you received labwork today, you will receive an invoice from [facility]. Please contact [facility] at [phone] with questions or concerns regarding your invoice.   Our billing staff will not be able to assist you with questions regarding bills from these companies.  You will be contacted with the lab results as soon as they are available. The fastest way to get your results is to activate your My Chart account. Instructions are located on the last page of this paperwork. If you have not heard from us regarding the results in 2 weeks, please contact this office.      Scapular Winging Rehab Ask your health care provider which exercises are safe for you. Do exercises exactly as told by your health care provider and adjust them as directed. It is normal to feel mild stretching, pulling, tightness, or discomfort as you do these exercises, but you should stop right away if you feel sudden pain or your pain gets worse.Do not begin these exercises until told by your health care provider. Stretching and range of motion exercises These exercises warm up your muscles and joints and improve the movement and flexibility of your shoulder. These exercises also help to relieve pain, numbness, and tingling. Exercise A: Pendulum 1. Stand near a wall or a surface that you can hold onto for balance. 2. Bend at the waist and let your left / right arm hang straight down. Use your other arm to support you. 3. Relax your arm and shoulder muscles, and move your hips and your trunk so your left / right arm swings freely. Your arm should swing because of the motion of your body, not because you are using your arm or shoulder muscles. 4. Keep  moving so your arm swings in the following directions, as told by your health care provider:  Side to side.  Forward and backward.  In clockwise and counterclockwise circles. 5. Slowly return to the starting position. Repeat __________ times. Complete this exercise __________ times a day. Exercise B: Flexion, seated 1. Sit in a stable chair so your left / right forearm can rest on a flat surface. Your elbow should rest at a height that keeps your upper arm next to your body. 2. Keeping your shoulder relaxed, lean forward at the waist and let your hand slide forward. Stop when you feel a stretch in your shoulder, or when you reach the angle that is recommended by your health care provider. 3. Hold for __________ seconds. 4. Slowly return to the starting position. Repeat __________ times. Complete this exercise __________ times a day. Exercise C: Flexion, standing 1. Stand and hold a broomstick, a cane, or a similar object with your hands a little more than shoulder-width apart on the object. Your left / right hand should be palm-up, and your other hand should be palm-down. 2. Use both hands to raise the stick in front of your body and then over your head. Stop when you feel a stretch in your shoulder, or when you reach the angle that is recommended by your health care provider.  Keep your left / right elbow straight and keep your shoulder muscles relaxed.  Avoid shrugging your shoulder while you raise your arm. Keep your shoulder blade tucked down  toward the middle of your spine. 3. Hold for __________ seconds. 4. Slowly return to the starting position. Repeat __________ times. Complete this exercise __________ times a day. Exercise D: Abduction, supine 1. Lie on your back and hold a broomstick, a cane, or a similar object. Place your hands a little more than shoulder-width apart on the object. Your left / right hand should be palm-up, and your other hand should be palm-down. 2. Push the  stick to raise your left / right arm out to your side and then over your head. Use your other hand to help move the stick. Stop when you feel a stretch in your shoulder, or when you reach the angle that is recommended by your health care provider.  Avoid shrugging your shoulder while you raise your arm. Keep your shoulder blade tucked down toward the middle of your spine. 3. Hold for __________ seconds. 4. Slowly return to the starting position. Repeat __________ times. Complete this exercise __________ times a day. Exercise E: Flexion, active-assisted 1. Lie on your back. You may bend your knees for comfort. 2. Hold a broomstick, a cane, or a similar object with your hands about shoulder-width apart on the object. Your palms should face toward your feet. 3. Use both hands to raise the stick toward the ceiling. Continue by moving your arms in front of your face, then behind your head, toward the floor. Use your uninjured arm to help your left / right arm move farther. Stop when you feel a gentle stretch in your shoulder, or when you reach the angle where your health care provider tells you to stop. 4. Hold for __________ seconds. 5. Slowly return to the starting position. Repeat __________ times. Complete this exercise __________ times a day. Strengthening exercises These exercises build strength and endurance in your shoulder. Endurance is the ability to use your muscles for a long time, even after they get tired. Exercise F: Scapular depression and adduction 1. Sit on a stable chair. Support your arms in front of you with pillows, armrests, or a tabletop. Keep your elbows near the sides of your body. 2. Gently move your shoulder blades down and back toward your spine. Relax the muscles on the tops of your shoulders and in the back of your neck. 3. Hold for __________ seconds. 4. Slowly release the tension, and relax your muscles completely before you repeat the exercise. Repeat __________  times. Complete this exercise __________ times a day. After you have practiced this exercise, try doing it without the arm support. Then, try doing it while standing instead of sitting. Exercise G: Scapular protraction, standing 1. Stand so you are facing a wall, about one arm-length away from the wall. 2. Place your hands on the wall and straighten your elbows. 3. Move your shoulder blades down, toward the middle of your spine. 4. Keep your shoulder blades down and move them forward, toward the wall. You should feel your shoulder blades sliding forward, around your ribcage.  If you are not sure that you are doing this exercise correctly, ask your health care provider for more instructions. 5. Hold for __________ seconds. 6. Slowly return to the starting position. Let your muscles relax completely before you repeat this exercise. Repeat __________ times. Complete this exercise __________ times a day. Exercise H: Scapular protraction, supine 1. Lie on your back on a firm surface. Hold a __________ weight in your left / right hand. 2. Raise your left / right arm straight into the air so  your hand is directly above your shoulder joint. 3. Push the weight into the air so your shoulder blade lifts off of the surface that you are lying on. Do not move your head, neck, or back. 4. Hold for __________ seconds. 5. Slowly return to the starting position. Let your muscles relax completely before you repeat this exercise. Repeat __________ times. Complete this exercise __________ times a day. Exercise I: Scapular protraction, quadruped 1. Get on your hands and knees. Your hands should be directly below your shoulder blades. 2. Straighten your arms until your elbows are locked. 3. Round your back as much as you can. Think about lifting your rib cage up into your shoulder blades. Keep your neck muscles relaxed. 4. Hold for __________ seconds. 5. Slowly return to the starting position. Let your muscles relax  completely before you repeat this exercise. Repeat __________ times. Complete this exercise __________ times a day. Exercise J: Scapular depression 1. Sit in a stable chair that has armrests. Sit upright, with your feet flat on the floor. 2. Put your hands on the armrests with your elbows bent and your fingers pointing forward. Your hands should be about even with the sides of your body. 3. Push down on the armrests to lift yourself off of the chair. Straighten your elbows and lift yourself up as much as you comfortably can.  Move your shoulder blades down and back.  Do not let your shoulders move up toward your ears.  Keep your feet on the ground. As you get stronger, your feet should support less of your body weight as you do this exercise. 4. Hold for __________ seconds. 5. Slowly lower yourself back into the chair. Repeat __________ times. Complete this exercise __________ times a day. Exercise K: Shoulder extension, prone 1. Lie on your abdomen on a firm surface so your left / right arm hangs over the edge. 2. Hold a __________ weight in your left / right hand so your palm faces in toward your body. Your arm should be straight. 3. Squeeze your shoulder blade down toward the middle of your back. 4. Slowly raise your arm behind you and toward the ceiling, up to the height of the surface that you are lying on. Keep your arm straight. 5. Hold for __________ seconds. 6. Slowly return to the starting position and relax your muscles. Repeat __________ times. Complete this exercise __________ times a day. Exercise L: Horizontal abduction, prone 1. Lie on your abdomen on a firm surface so your left / right arm hangs over the edge. 2. Hold a __________ weight in your hand so your palm faces toward your feet. Your arm should be straight. 3. Squeeze your shoulder blade down toward the middle of your back. 4. Bend your elbow so your hand moves up, until your elbow is bent to an "L" shape (90  degree angle). With your elbow bent, slowly move your forearm forward and up. Raise your hand up to the height of the surface that you are lying on.  Your upper arm should not move, and your elbow should stay bent.  At the top of the movement, your palm should face the floor. 5. Hold for __________ seconds. 6. Slowly return to the starting position and relax your muscles. Repeat __________ times. Complete this exercise __________ times a day. Exercise M: Scapular retraction 1. Sit in a stable chair without armrests, or stand. 2. Secure an exercise band to a stable object in front of you so it is at  shoulder height. 3. Hold one end of the exercise band in each hand. Your palms should face down. 4. Straighten your elbows and lift your arms up to shoulder height. 5. Step back, away from the secured end of the exercise band, until the band stretches. 6. Squeeze your shoulder blades together and move your elbows back behind you. Do not shrug your shoulders while you do this.  Your elbows should stay at about chest or shoulder height.  Keep your upper arms lifted, away from your sides. 7. Hold for __________ seconds. 8. Slowly return to the starting position. Repeat __________ times. Complete this exercise __________ times a day. Exercise N: Shoulder extension 1. Sit in a stable chair without armrests, or stand. 2. Secure an exercise band to a stable object in front of you so it is at shoulder height. 3. Hold one end of the exercise band in each hand. Your palms should face each other. 4. Straighten your elbows and lift your hands up to shoulder height. 5. Step back, away from the secured end of the exercise band, until the band stretches. 6. Squeeze your shoulder blades together and pull your hands down to the sides of your thighs. Stop when your hands are straight down by your sides. Do not let your hands go behind your body. 7. Hold for __________ seconds. 8. Slowly return to the starting  position. Repeat __________ times. Complete this exercise __________ times a day. Exercise O: Scapular retraction and external rotation 1. Sit in a stable chair without armrests, or stand. 2. Secure an exercise band to a stable object in front of you so it is at shoulder height. 3. Hold one end of the exercise band in each hand. Your palms should face down. 4. Straighten your elbows and lift your hands up to shoulder height. 5. Step back, away from the secured end of the exercise band, until the band stretches. 6. Bend your elbows and raise your hands up to the height of your head.  Your palms should face out, in front of you, at the top of the movement.  Squeeze your shoulder blades together during this movement. 7. Hold for __________ seconds. 8. Slowly straighten your arms to return to the starting position. Repeat __________ times. Complete this exercise __________ times a day. This information is not intended to replace advice given to you by your health care provider. Make sure you discuss any questions you have with your health care provider. Document Released: 03/22/2005 Document Revised: 11/27/2015 Document Reviewed: 02/14/2015 Elsevier Interactive Patient Education  2017 [employer].

## 2016-05-04 NOTE — Progress Notes (Signed)
 Subjective:    Patient ID: Amanda Haley, female    DOB: 10/07/1975, 40 y.o.   MRN: [id number]  05/04/2016  Back Pain and Chest Pain (sharp pain with inhaled on Thursday )   HPI This 40 y.o. female presents for evaluation of back pain and chest pain onset five days ago.  Having upper R back pain especially when flexing neck.  Also having tenderness along R anterior chest.  Not sure if pain in chest and radiating to back or vice versa.  Can move with good range of motion yet pain worsens with movement of upper body.  At church with onset and had hands over head with celebration and singing.  Not sure if music, heat, or people, but took a deep breath and had a sharp deep breath.  Then took a few short shallow breaths for a few breaths and then took a few deep breaths with improvement.  Now breathing just fine.  Not sure what caused sharp shooting stabbing pain.  Now having more chest soreness.  Having light headache on R.  Not completely formed headache but could turn into one?  Two days ago, felt worse and then yesterday had severe pain in upper back.  Usually feels that upper neck issues are in center and on R side.  Headache on R side.   S/p cervical spine films in 06/2015 for similar symptoms. S/p CXR in 02/2016. S/p EKG on 06/30/15 and 08/30/15.   Worried about fibromyalgia. S/p cardiology consultation one year ago; did not keep appointment with cardiology stress testing.  Hemoptysis/sinusitis: blood-tinged PND has resolved but congestion is colored since treatment with Amoxicillin at last visit..  Has continued nasal spray yet has decreased to twice weekly.  Took abx which resolved bloody mucous.  Now more color to mucous for several weeks.  Intermittent colored mucous.   Did not undergo ENT consultation after last visit.   Review of Systems  Constitutional: Negative for chills, diaphoresis, fatigue and fever.  HENT: Positive for congestion, postnasal drip and rhinorrhea. Negative for sinus pain  and sinus pressure.   Eyes: Negative for visual disturbance.  Respiratory: Positive for chest tightness. Negative for cough and shortness of breath.   Cardiovascular: Positive for chest pain and palpitations. Negative for leg swelling.  Gastrointestinal: Negative for abdominal pain, constipation, diarrhea, nausea and vomiting.  Endocrine: Negative for cold intolerance, heat intolerance, polydipsia, polyphagia and polyuria.  Musculoskeletal: Positive for myalgias, neck pain and neck stiffness.  Neurological: Negative for dizziness, tremors, seizures, syncope, facial asymmetry, speech difficulty, weakness, light-headedness, numbness and headaches.  Psychiatric/Behavioral: The patient is nervous/anxious.     Past Medical History:  Diagnosis Date  . Allergy   . Anemia   . Back pain   . Chronic headaches   . Eczema   . Hypertension   . Vision changes    Past Surgical History:  Procedure Laterality Date  . CERVICAL POLYPECTOMY  2013  . DILATION AND CURETTAGE OF UTERUS  2013   No Known Allergies  Social History   Social History  . Marital status: Single    Spouse name: N/A  . Number of children: N/A  . Years of education: graduate   Occupational History  . researcher [employer]   Social History Main Topics  . Smoking status: Never Smoker  . Smokeless tobacco: Never Used  . Alcohol use 0.0 oz/week     Comment: socially  . Drug use: No  .   Sexual activity: Not on file   Other Topics Concern  . Not on file   Social History Narrative   Marital status: single; not dating.      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      Lives: alone; family in [place] and [place].               Family History  Problem Relation Age of Onset  . Esophageal cancer Father     and throat cancer  . Breast cancer Mother 49    breast cancer  . Colon cancer Maternal  Grandfather   . Dementia Maternal Grandmother        Objective:    BP (!) 137/93 (BP Location: Right Arm, Patient Position: Sitting, Cuff Size: Small)   Pulse (!) 104   Temp 98.6 F (37 C) (Oral)   Resp 18   Ht 5' 8.5" (1.74 m)   Wt 250 lb (113.4 kg)   LMP 04/25/2016 (Exact Date)   SpO2 100%   BMI 37.46 kg/m  Physical Exam  Constitutional: She is oriented to person, place, and time. She appears well-developed and well-nourished. No distress.  HENT:  Head: Normocephalic and atraumatic.  Right Ear: External ear normal.  Left Ear: External ear normal.  Nose: Mucosal edema and rhinorrhea present. No nasal septal hematoma. No epistaxis.  No foreign bodies. Right sinus exhibits no maxillary sinus tenderness and no frontal sinus tenderness. Left sinus exhibits no maxillary sinus tenderness and no frontal sinus tenderness.  Mouth/Throat: Oropharynx is clear and moist.  Eyes: Conjunctivae and EOM are normal. Pupils are equal, round, and reactive to light.  Neck: Normal range of motion. Neck supple. Carotid bruit is not present. No thyromegaly present.  Cardiovascular: Normal rate, regular rhythm, normal heart sounds and intact distal pulses.  Exam reveals no gallop and no friction rub.   No murmur heard. Pulmonary/Chest: Effort normal and breath sounds normal. She has no wheezes. She has no rales. She exhibits tenderness.    Abdominal: Soft. Bowel sounds are normal. She exhibits no distension and no mass. There is no tenderness. There is no rebound and no guarding.  Musculoskeletal:       Right shoulder: Normal.       Left shoulder: Normal.       Cervical back: She exhibits tenderness, pain and spasm. She exhibits normal range of motion, no bony tenderness and normal pulse.       Thoracic back: Normal.  Lymphadenopathy:    She has no cervical adenopathy.  Neurological: She is alert and oriented to person, place, and time. No cranial nerve deficit.  Skin: Skin is warm and dry. No rash  noted. She is not diaphoretic. No erythema. No pallor.  Psychiatric: She has a normal mood and affect. Her behavior is normal.   Results for orders placed or performed in visit on 09/02/15  CBC with Differential/Platelet  Result Value Ref Range   WBC 8.7 3.8 - 10.8 K/uL   RBC 4.70 3.80 - 5.10 MIL/uL   Hemoglobin 13.7 11.7 - 15.5 g/dL   HCT 42.6 35.0 - 45.0 %   MCV 90.6 80.0 - 100.0 fL   MCH 29.1 27.0 - 33.0 pg   MCHC 32.2 32.0 - 36.0 g/dL   RDW 12.9 11.0 - 15.0 %   Platelets 301 140 - 400 K/uL  MPV 10.3 7.5 - 12.5 fL   Neutro Abs 5,916 1,500 - 7,800 cells/uL   Lymphs Abs 2,349 850 - 3,900 cells/uL   Monocytes Absolute 348 200 - 950 cells/uL   Eosinophils Absolute 87 15 - 500 cells/uL   Basophils Absolute 0 0 - 200 cells/uL   Neutrophils Relative % 68 %   Lymphocytes Relative 27 %   Monocytes Relative 4 %   Eosinophils Relative 1 %   Basophils Relative 0 %   Smear Review Criteria for review not met   Comprehensive metabolic panel  Result Value Ref Range   Sodium 137 135 - 146 mmol/L   Potassium 4.2 3.5 - 5.3 mmol/L   Chloride 98 98 - 110 mmol/L   CO2 26 20 - 31 mmol/L   Glucose, Bld 73 65 - 99 mg/dL   BUN 7 7 - 25 mg/dL   Creat 0.79 0.50 - 1.10 mg/dL   Total Bilirubin 1.3 (H) 0.2 - 1.2 mg/dL   Alkaline Phosphatase 63 33 - 115 U/L   AST 17 10 - 30 U/L   ALT 12 6 - 29 U/L   Total Protein 8.0 6.1 - 8.1 g/dL   Albumin 4.9 3.6 - 5.1 g/dL   Calcium 9.9 8.6 - 10.2 mg/dL  TSH  Result Value Ref Range   TSH 1.09 mIU/L  Vitamin B12  Result Value Ref Range   Vitamin B-12 420 200 - 1,100 pg/mL  VITAMIN D 25 Hydroxy (Vit-D Deficiency, Fractures)  Result Value Ref Range   Vit D, 25-Hydroxy 28 (L) 30 - 100 ng/mL  Sedimentation rate  Result Value Ref Range   Sed Rate 4 0 - 20 mm/hr       Assessment & Plan:   1. Costochondritis   2. Neck sprain, initial encounter   3. Acute seasonal allergic rhinitis due to pollen   4. Essential hypertension, benign   5. Myalgia     -Persistent costochondritis and intermittent neck pain; refer to rheumatology to evaluate for fibromyalgia or other systemic process contributing to a variety of myalgias. Pt very reluctant to take medication; thus, recommend ice to chest wall and heat to neck.  Chest pain continues to worsen/recur with rotation of torso or upper body; no exertional component to chest pain thus cardiac etiology unlikely.  I referred pt in past year to cardiology for consultation yet pt non-compliant with stress testing at that time.   -refer to physical therapy for neck sprain and ongoing costochondritis. -monitor blood pressure closely; call if BP remains > 140/90.  -continues to suffer with ongoing allergic rhinitis; recommend compliance with daily antihistamine and Flonase.  Also consider nasal saline qhs.  -pt suffers with significant anxiety about overall health and well being; spent 30 minutes face to face with greater than 50% of time dedicated to coordination of care and counseling.   Orders Placed This Encounter  Procedures  . Ambulatory referral to Rheumatology    Referral Priority:   Routine    Referral Type:   Consultation    Referral Reason:   Specialty Services Required    Requested Specialty:   Rheumatology    Number of Visits Requested:   1  . Ambulatory referral to Physical Therapy    Referral Priority:   Routine    Referral Type:   Physical Medicine    Referral Reason:   Specialty Services Required    Requested Specialty:   Physical Therapy    Number of Visits Requested:   1     No orders of the defined types were placed in this encounter.   No Follow-up on file.   Kristi Elayne Guerin, M.D. Primary Care at [facility] previously [facility] [address] [place], [place]  [address] [phone] phone [phone] fax

## 2016-05-07 ENCOUNTER — Encounter: Payer: Self-pay | Admitting: Family Medicine

## 2016-05-07 DIAGNOSIS — J309 Allergic rhinitis, unspecified: Secondary | ICD-10-CM

## 2016-05-07 DIAGNOSIS — R0982 Postnasal drip: Principal | ICD-10-CM

## 2016-05-07 DIAGNOSIS — R0789 Other chest pain: Secondary | ICD-10-CM

## 2016-05-07 DIAGNOSIS — R002 Palpitations: Secondary | ICD-10-CM

## 2016-05-08 ENCOUNTER — Encounter (HOSPITAL_COMMUNITY): Payer: Self-pay | Admitting: Family Medicine

## 2016-05-08 ENCOUNTER — Ambulatory Visit (HOSPITAL_COMMUNITY)
Admission: EM | Admit: 2016-05-08 | Discharge: 2016-05-08 | Disposition: A | Discharge status: Home / Self Care | Payer: BC Managed Care – PPO | Attending: Family Medicine | Admitting: Family Medicine

## 2016-05-08 DIAGNOSIS — R0789 Other chest pain: Secondary | ICD-10-CM | POA: Diagnosis not present

## 2016-05-08 MED ORDER — DICLOFENAC SODIUM 50 MG PO TBEC: 50.0000 mg | DELAYED_RELEASE_TABLET | Freq: Two times a day (BID) | ORAL | 0 refills | Status: DC

## 2016-05-08 NOTE — Discharge Instructions (Signed)
I believe much of your pain has it with tightness in the back and chest muscles. I think physical therapy is the correct approach to solving this. None of your symptoms or physical exam findings suggest a cardiac or respiratory problem.  I prescribed for you and anti-inflammatory medicine which should help reduce your symptoms. Nevertheless, the long-term strategies suggested by Dr. Katrinka BlazingSmith, that is, physical therapy, I think is most likely provide long-term relief.

## 2016-05-08 NOTE — ED Provider Notes (Signed)
[facility]    CSN: [id number] Arrival date & time: 05/08/16  1908     History   Chief Complaint Chief Complaint  Patient presents with  . Chest Pain  . Headache    HPI Amanda Haley is a 41 y.o. female.   Is a 41 year old woman who presents with chest pain and headache. 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  Patient has had intermittent presternal chest pain for over a week. She's also had associated upper back tightness and soreness off and on for months. This is gotten worse lately. She notes that she has some numbness from time to time in her left arm for over a year.. She says that she gets short of breath when she climbs stairs, but this is been a problem for over 2 years.  Patient's had some slight numbness in her left foot intermittently. She says it just feels different from the other one.  Patient was seen by her primary care doctor on Wednesday and nothing was prescribed. Symptoms were not thought to be cardiac or respiratory.  In the past patient's had troponins tested as well as repeat electrocardiograms all of which have been negative. She is anxious about these symptoms and thinks they may represent heart disease.      Past Medical History:  Diagnosis Date  . Allergy   . Anemia   . Back pain   . Chronic headaches   . Eczema   . Hypertension   . Vision changes     Patient Active Problem List   Diagnosis Date Noted  . Seasonal allergic rhinitis due to pollen 12/27/2015  . Generalized anxiety disorder 01/31/2015  . Essential hypertension, benign 01/31/2015  . Atypical chest pain 06/29/2013  . Constipation 12/31/2011  . Anxiety 12/31/2011  . Eczema 12/31/2011    Past Surgical History:  Procedure Laterality Date  . CERVICAL POLYPECTOMY  2013  . DILATION AND CURETTAGE OF UTERUS  2013    OB History    No data available       Home Medications    Prior to Admission medications     Medication Sig Start Date End Date Taking? Authorizing Provider  amLODipine (NORVASC) 10 MG tablet Take 1 tablet (10 mg total) by mouth daily. 03/08/16   Ethelda Chick, MD  clobetasol ointment (TEMOVATE) 0.05 % APPLY  OINTMENT TOPICALLY TWICE DAILY 04/22/16   Ethelda Chick, MD  diclofenac (VOLTAREN) 50 MG EC tablet Take 1 tablet (50 mg total) by mouth 2 (two) times daily. 05/08/16   Elvina Sidle, MD    Family History Family History  Problem Relation Age of Onset  . Esophageal cancer Father     and throat cancer  . Breast cancer Mother 79    breast cancer  . Colon cancer Maternal Grandfather   . Dementia Maternal Grandmother     Social History Social History  Substance Use Topics  . Smoking status: Never Smoker  . Smokeless tobacco: Never Used  . Alcohol use 0.0 oz/week     Comment: socially     Allergies   Patient has no known allergies.   Review of Systems Review of Systems  Constitutional: Negative.   HENT: Negative.   Eyes: Negative.   Respiratory: Positive for chest tightness.   Cardiovascular: Positive for chest pain.  Gastrointestinal: Negative.   Genitourinary: Negative.   Musculoskeletal: Positive for back pain and myalgias.  Neurological: Positive for numbness.  Psychiatric/Behavioral: The patient is nervous/anxious.  Physical Exam Triage Vital Signs ED Triage Vitals [05/08/16 2009]  Enc Vitals Group     BP 135/96     Pulse Rate 90     Resp 18     Temp 98.4 F (36.9 C)     Temp src      SpO2 100 %     Weight      Height      Head Circumference      Peak Flow      Pain Score 5     Pain Loc      Pain Edu?      Excl. in GC?    No data found.   Updated Vital Signs BP 135/96   Pulse 90   Temp 98.4 F (36.9 C)   Resp 18   LMP 04/25/2016 (Exact Date)   SpO2 100%    Physical Exam  Constitutional: She is oriented to person, place, and time. She appears well-developed and well-nourished.  HENT:  Head: Normocephalic.  Right Ear:  External ear normal.  Left Ear: External ear normal.  Mouth/Throat: Oropharynx is clear and moist.  Eyes: Conjunctivae and EOM are normal. Pupils are equal, round, and reactive to light.  Neck: Normal range of motion. Neck supple.  Cardiovascular: Normal rate, regular rhythm and normal heart sounds.   Pulmonary/Chest: Effort normal and breath sounds normal.  Musculoskeletal: Normal range of motion.  Lymphadenopathy:    She has no cervical adenopathy.  Neurological: She is alert and oriented to person, place, and time.  Skin: Skin is warm and dry.  Nursing note and vitals reviewed.    UC Treatments / Results  Labs (all labs ordered are listed, but only abnormal results are displayed) Labs Reviewed - No data to display  EKG  EKG Interpretation None       Radiology No results found.  Procedures Procedures (including critical care time)  Medications Ordered in UC Medications - No data to display   Initial Impression / Assessment and Plan / UC Course  I have reviewed the triage vital signs and the nursing notes.  Pertinent labs & imaging results that were available during my care of the patient were reviewed by me and considered in my medical decision making (see chart for details).   Final Clinical Impressions(s) / UC Diagnoses   Final diagnoses:  Chest wall pain  Atypical chest pain    New Prescriptions New Prescriptions   DICLOFENAC (VOLTAREN) 50 MG EC TABLET    Take 1 tablet (50 mg total) by mouth 2 (two) times daily.  I believe much of your pain has it with tightness in the back and chest muscles. I think physical therapy is the correct approach to solving this. None of your symptoms or physical exam findings suggest a cardiac or respiratory problem.   Elvina SidleKurt Demyah Smyre, MD 05/08/16 2050

## 2016-05-08 NOTE — ED Triage Notes (Signed)
Pt here for intermittent chest pain worse with breathing and touching. sts also slight HA. sts that she has had some pain in upper back and shoulder.

## 2016-05-21 ENCOUNTER — Encounter: Payer: Self-pay | Admitting: Cardiovascular Disease

## 2016-05-21 ENCOUNTER — Ambulatory Visit (INDEPENDENT_AMBULATORY_CARE_PROVIDER_SITE_OTHER): Payer: BC Managed Care – PPO | Admitting: Cardiovascular Disease

## 2016-05-21 VITALS — BP 114/86 | HR 94 | Ht 68.0 in | Wt 238.0 lb

## 2016-05-21 DIAGNOSIS — R079 Chest pain, unspecified: Secondary | ICD-10-CM

## 2016-05-21 DIAGNOSIS — Z13220 Encounter for screening for lipoid disorders: Secondary | ICD-10-CM

## 2016-05-21 MED ORDER — LORAZEPAM 2 MG PO TABS: 2.0000 mg | ORAL_TABLET | Freq: Four times a day (QID) | ORAL | 0 refills | Status: DC | PRN

## 2016-05-21 NOTE — Progress Notes (Signed)
Cardiology Office Note   Date:  05/21/2016   ID:  Amanda Haley, DOB 1975-07-24, MRN [id number]  PCP:  Amanda Simmer, MD  Cardiologist:   Amanda Si, MD   Chief Complaint  Patient presents with  . Follow-up    New patient.  . Numbness    ARMS AND BOTTOMS OF HER FEET  . Chest Pain    ABOUT 2 WEEKS AGO.  Marland Kitchen Fatigue  . Headache      History of Present Illness: Amanda Haley is a 41 y.o. female with hypertension and anxiety who presents for an evaluation of chest pain.  She has been experiencing chest discomfort in the center of her chest for over a year.  It feels like a pulling sensation in the center of her chest.  It is constantly there but sometimes more noticeable than others.  She doesn't get regular exercise but walks up three flights of steps to her home and the chest pain. She also sometimes feels like there is a blood pressure cuff around her left upper arm.  She notes that she feels more easily fatigued than in the past.  She also reports numbness in her left hand and leg.  She denies shortness of breath, nausea, or diaphoresis  Amanda Haley was seen in the ED 05/08/16 with chest pain. No EKG or cardiac enzymes were performed at that time. She saw Amanda Haley with chest pain in 2015 and was felt to be musculoskeletal. She did not have any additional workup at that time.  She has not noted any lower extremity edema, shortness of breath, orthopnea, or PND.  She does note occasional episodes of heart racing. It is occurred approximately 5 times in last for about 3 minutes at a time. There is mild associated lightheadedness but no shortness of breath. Several times it occurs prior to her having to give a presentation. However, she does not think that there is any significant anxiety associated with this, as she works as a Radio producer and doesn't have any anxiety with these have situations.  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   Past Medical History:  Diagnosis Date  .  Allergy   . Anemia   . Back pain   . Chronic headaches   . Eczema   . Hypertension   . Vision changes     Past Surgical History:  Procedure Laterality Date  . CERVICAL POLYPECTOMY  2013  . DILATION AND CURETTAGE OF UTERUS  2013     Current Outpatient Prescriptions  Medication Sig Dispense Refill  . amLODipine (NORVASC) 10 MG tablet Take 1 tablet (10 mg total) by mouth daily. 90 tablet 1  . clobetasol ointment (TEMOVATE) 0.05 % APPLY  OINTMENT TOPICALLY TWICE DAILY 60 g 0  . diclofenac (VOLTAREN) 50 MG EC tablet Take 1 tablet (50 mg total) by mouth 2 (two) times daily. 14 tablet 0   No current facility-administered medications for this visit.     Allergies:   Patient has no known allergies.    Social History:  The patient  reports that she has never smoked. She has never used smokeless tobacco. She reports that she drinks alcohol. She reports that she does not use drugs.   Family History:  The patient's family history includes Breast cancer (age of onset: 48) in her mother; Colon cancer in her maternal grandfather; Dementia in her maternal grandmother; Esophageal cancer in her father; Heart attack in her cousin.    ROS:  Please see  the history of present illness.   Otherwise, review of systems are positive for none.   All other systems are reviewed and negative.    PHYSICAL EXAM: VS:  BP 114/86 Comment: Right arm.  Pulse 94   Ht 5\' 8"  (1.727 m)   Wt 108 kg (238 lb)   LMP 04/25/2016 (Exact Date)   BMI 36.19 kg/m  , BMI Body mass index is 36.19 kg/m. GENERAL:  Well appearing HEENT:  Pupils equal round and reactive, fundi not visualized, oral mucosa unremarkable NECK:  No jugular venous distention, waveform within normal limits, carotid upstroke brisk and symmetric, no bruits, no thyromegaly LYMPHATICS:  No cervical adenopathy LUNGS:  Clear to auscultation bilaterally HEART:  RRR.  PMI not displaced or sustained,S1 and S2 within normal limits, no S3, no S4, no clicks, no  rubs, I/VI systolic murmur at the LUSB ABD:  Flat, positive bowel sounds normal in frequency in pitch, no bruits, no rebound, no guarding, no midline pulsatile mass, no hepatomegaly, no splenomegaly EXT:  2 plus pulses throughout, no edema, no cyanosis no clubbing SKIN:  No rashes no nodules NEURO:  Cranial nerves II through XII grossly intact, motor grossly intact throughout PSYCH:  Cognitively intact, oriented to person place and time    EKG:  EKG is ordered today. The ekg ordered today demonstrates sinus rhythm rate 94 bpm    Recent Labs: 09/02/2015: ALT 12; BUN 7; Creat 0.79; Hemoglobin 13.7; Platelets 301; Potassium 4.2; Sodium 137; TSH 1.09    Lipid Panel    Component Value Date/Time   CHOL 182 07/02/2015 1941   TRIG 80 07/02/2015 1941   HDL 45 (L) 07/02/2015 1941   CHOLHDL 4.0 07/02/2015 1941   VLDL 16 07/02/2015 1941   LDLCALC 121 07/02/2015 1941      Wt Readings from Last 3 Encounters:  05/21/16 108 kg (238 lb)  05/04/16 113.4 kg (250 lb)  02/17/16 110 kg (242 lb 9.6 oz)      ASSESSMENT AND PLAN:  # Atypical Chest pain:  Amanda Haley's symptoms are very atypical. Given the fact that the pain is constantly there and has been present for years, I suspect that there is no ischemia.  However, given that this has beenl ongoing for some long we will pursue a cardiac CTA to better evaluate her coronary arteries.  # Hypertension: Systolic blood pressure is well-controlled but her diastolic blood pressure is mildly elevated. Temperature 10 to increase her antihypertensive given that we will likely cause dizziness. Continue amlodipine.   # Obesity:  Amanda Haley has been using Weight [employer] and lost 12 pounds. She was congratulated on these efforts and was encouraged to continue. Assuming her cardiac CTA is negative, would recommend that she start an exercise program as well.   # Murmur: Systolic murmur noted on exam..  Likely flow murmur.  Will get an echo to assess given her  increased shortness of breath.   Current medicines are reviewed at length with the patient today.  The patient does not have concerns regarding medicines.  The following changes have been made:  no change  Labs/ tests ordered today include:   Orders Placed This Encounter  Procedures  . CT Heart Morp W/Cta Cor W/Score W/Ca W/Cm &/Or Wo/Cm  . Lipid panel  . Comprehensive metabolic panel  . EKG 12-Lead  . ECHOCARDIOGRAM COMPLETE     Disposition:   FU with Olusegun Gerstenberger C. Duke Salviaandolph, MD, [facility] 1 month    This note was written  with the assistance of speech recognition software.  Please excuse any transcriptional errors.  Signed, Loie Jahr C. Duke Salvia, MD, [facility]  05/21/2016 11:53 AM    [facility]

## 2016-05-21 NOTE — Patient Instructions (Addendum)
Medication Instructions:  TAKE ONE ATIVAN (LORAZEPAM)  PRIOR TO YOUR CARDIAC CT, RX HAS BEEN CALLED TO YOUR PHARMACY YOU WILL NEED SOMEONE TO DRIVE YOU TO AND FROM THE CT  Labwork: LP/CMET AT [facility] ON THE FIRST FLOOR  Testing/Procedures: Your physician has requested that you have an echocardiogram. Echocardiography is a painless test that uses sound waves to create images of your heart. It provides your doctor with information about the size and shape of your heart and how well your heart's chambers and valves are working. This procedure takes approximately one hour. There are no restrictions for this procedure. [facility] AT [address]  CARDIAC CTA  Follow-Up: Your physician recommends that you schedule a follow-up appointment in: 1 MONTH  Any Other Special Instructions Will Be Listed Below (If Applicable).  Echocardiogram An echocardiogram, or echocardiography, uses sound waves (ultrasound) to produce an image of your heart. The echocardiogram is simple, painless, obtained within a short period of time, and offers valuable information to your health care provider. The images from an echocardiogram can provide information such as:  Evidence of coronary artery disease (CAD).  Heart size.  Heart muscle function.  Heart valve function.  Aneurysm detection.  Evidence of a past heart attack.  Fluid buildup around the heart.  Heart muscle thickening.  Assess heart valve function. Tell a health care provider about:  Any allergies you have.  All medicines you are taking, including vitamins, herbs, eye drops, creams, and over-the-counter medicines.  Any problems you or family members have had with anesthetic medicines.  Any blood disorders you have.  Any surgeries you have had.  Any medical conditions you have.  Whether you are pregnant or may be pregnant. What happens before the procedure? No special preparation is needed. Eat and drink  normally. What happens during the procedure?  In order to produce an image of your heart, gel will be applied to your chest and a wand-like tool (transducer) will be moved over your chest. The gel will help transmit the sound waves from the transducer. The sound waves will harmlessly bounce off your heart to allow the heart images to be captured in real-time motion. These images will then be recorded.  You may need an IV to receive a medicine that improves the quality of the pictures. What happens after the procedure? You may return to your normal schedule including diet, activities, and medicines, unless your health care provider tells you otherwise. This information is not intended to replace advice given to you by your health care provider. Make sure you discuss any questions you have with your health care provider. Document Released: 03/19/2000 Document Revised: 11/08/2015 Document Reviewed: 11/27/2012 Elsevier Interactive Patient Education  2017 [employer].    Cardiac CT Angiogram A cardiac CT angiogram is a test to help your health care provider find out why you are having chest pains or other symptoms of heart disease. The test uses an advanced type of X-ray machine that scans your heart and the area around the heart and creates multiple pictures of it. Other names for the test are coronary CT angiography, coronary artery scanning, and CTA.  The test is painless and fairly quick. It is noninvasive. That means it does not involve any type of surgery or cuts (incisions). Instead, a fluid called contrast dye is injected into an IV tube in your arm. The contrast dye acts as a highlighter as it flows through the veins. With the CT scan,  it lets your health care provider see:   If the coronary arteries in your heart are more narrow than they should be, or if they are blocked.  If there is fluid around the heart.  If the muscles and tissues of the heart look weak or show signs of  disease.  If the lungs contain any blood clots. LET [facility] CARE PROVIDER KNOW ABOUT:  Any allergies you have.   All medicines you are taking, including vitamins, herbs, eye drops, creams, and over-the-counter medicines.  Previous problems you or members of your family have had with the use of anesthetics.  Any blood disorders you have.  Previous surgeries you have had.  Medical conditions you have. RISKS AND COMPLICATIONS Generally, this is a safe procedure. However, as with any procedure, problems can occur. Possible problems include:   Allergic reaction to the contrast dye. This can range from mild to severe and may include:   Itching at the IV tube insertion site.   Redness at the IV tube insertion site.   Hives.   Nausea.   Difficulty breathing.   Kidney failure.   Problems from radiation exposure. This test involves the use of radiation. Radiation exposure can be dangerous to a pregnant patient and fetus. If you are pregnant, shields are used to protect your belly and pelvic area. More details are available from your health care provider. BEFORE THE PROCEDURE  The day before the test:    Stop drinking caffeinated beverages. These include energy drinks, tea, soda, coffee, and hot chocolate.  Stop taking medicines to treat erectile dysfunction. They can interfere with medicines you may be given during the procedure. Check with your health care provider if you should stop taking any other medicines. On the day of the test:   About 4 hours before the test, stop eating and drinking anything but water as advised by your health care provider.  Avoid wearing jewelry. You will have to undress from the waist up and wear a hospital gown. PROCEDURE  The hair on your chest may need to be shaved. This is done because small sticky patches called electrodes are put on your chest. These transmit information that helps monitor your heart during the test.  You might  be given heart medicine during the test. This is done to control your heart rate during the test so a good image is obtained.  An IV tube will be inserted in your arm.  You will be asked to lie on a table with your arms above your head.  The contrast dye will be injected into the IV tube. You might feel warm or you may get a metallic taste in your mouth.  The table you are lying on will move into a large machine that will do the scanning.  You will be able to see, hear, and talk to the person running the machine while you are in it. Follow that person's directions. You may be asked to hold your breath for 2-3 seconds as pictures are taken.  The CT machine will move around you to take pictures. Do not move while it is scanning. This helps to get a good image of your heart.  When the best possible pictures have been taken, the machine will be turned off. The table will move out of the machine. The IV tube will then be removed. AFTER THE PROCEDURE  You will be allowed to get dressed and return to your normal activities.  Results will be interpreted by  the health care provider and the results will be discussed with you. This information is not intended to replace advice given to you by your health care provider. Make sure you discuss any questions you have with your health care provider. Document Released: 03/04/2008 Document Revised: 04/12/2014 Document Reviewed: 12/06/2012 Elsevier Interactive Patient Education  2017 [employer].

## 2016-06-02 ENCOUNTER — Ambulatory Visit: Payer: BC Managed Care – PPO | Admitting: Family Medicine

## 2016-06-11 ENCOUNTER — Telehealth: Payer: Self-pay | Admitting: Cardiovascular Disease

## 2016-06-11 NOTE — Telephone Encounter (Signed)
Spoke with patient to schedule Cardiac Ct.   Per patient she has decline to having the test done.   Will close the order.

## 2016-06-14 ENCOUNTER — Other Ambulatory Visit (HOSPITAL_COMMUNITY): Payer: BC Managed Care – PPO

## 2016-06-17 NOTE — Telephone Encounter (Signed)
Will forward to Dr [place] so she will be aware ?

## 2016-06-25 ENCOUNTER — Ambulatory Visit: Payer: BC Managed Care – PPO | Admitting: Cardiovascular Disease

## 2016-07-07 ENCOUNTER — Ambulatory Visit (INDEPENDENT_AMBULATORY_CARE_PROVIDER_SITE_OTHER): Payer: BC Managed Care – PPO | Admitting: Family Medicine

## 2016-07-07 ENCOUNTER — Encounter: Payer: Self-pay | Admitting: Family Medicine

## 2016-07-07 VITALS — BP 126/84 | HR 90 | Temp 98.2°F | Resp 16 | Ht 68.0 in | Wt 223.0 lb

## 2016-07-07 DIAGNOSIS — N939 Abnormal uterine and vaginal bleeding, unspecified: Secondary | ICD-10-CM | POA: Diagnosis not present

## 2016-07-07 DIAGNOSIS — F411 Generalized anxiety disorder: Secondary | ICD-10-CM | POA: Diagnosis not present

## 2016-07-07 DIAGNOSIS — J301 Allergic rhinitis due to pollen: Secondary | ICD-10-CM

## 2016-07-07 DIAGNOSIS — S139XXD Sprain of joints and ligaments of unspecified parts of neck, subsequent encounter: Secondary | ICD-10-CM

## 2016-07-07 DIAGNOSIS — Z6833 Body mass index (BMI) 33.0-33.9, adult: Secondary | ICD-10-CM

## 2016-07-07 DIAGNOSIS — I10 Essential (primary) hypertension: Secondary | ICD-10-CM | POA: Diagnosis not present

## 2016-07-07 DIAGNOSIS — R1011 Right upper quadrant pain: Secondary | ICD-10-CM | POA: Diagnosis not present

## 2016-07-07 DIAGNOSIS — M545 Low back pain: Secondary | ICD-10-CM | POA: Diagnosis not present

## 2016-07-07 DIAGNOSIS — G8929 Other chronic pain: Secondary | ICD-10-CM

## 2016-07-07 DIAGNOSIS — E6609 Other obesity due to excess calories: Secondary | ICD-10-CM

## 2016-07-07 DIAGNOSIS — L750 Bromhidrosis: Secondary | ICD-10-CM

## 2016-07-07 DIAGNOSIS — R0789 Other chest pain: Secondary | ICD-10-CM | POA: Diagnosis not present

## 2016-07-07 LAB — POCT URINALYSIS DIP (MANUAL ENTRY)
BILIRUBIN UA: NEGATIVE
Bilirubin, UA: NEGATIVE
Blood, UA: NEGATIVE
Glucose, UA: NEGATIVE
NITRITE UA: NEGATIVE
PROTEIN UA: NEGATIVE
Spec Grav, UA: 1.015 (ref 1.030–1.035)
Urobilinogen, UA: 0.2 (ref ?–2.0)
pH, UA: 6 (ref 5.0–8.0)

## 2016-07-07 LAB — HM MAMMOGRAPHY

## 2016-07-07 NOTE — Progress Notes (Signed)
Subjective:    Patient ID: Amanda Haley, female    DOB: Aug 05, 1975, 41 y.o.   MRN: [id number]  07/07/2016  Follow-up; Hypertension; Flank Pain (patient states she has had some pain on her sides; believes she is having urinary issues); and urinary odor (states that her urine has an strong odor)   HPI This 41 y.o. female presents for evaluation of hypertension, chest pain.  Has high deductible plan thus testing will cost $2000 with [place] [facility] due to echo being performed at a hospital facility.    Did not undergo ENT consultation yet but would like to schedule ENT consultation due to bloody PND, chronic rhinitis.    R side/abdominal pain: onset yesterday.  Similar pain one year ago and saw [place].  Intermittent nausea last night.  Should be starting cycle in upcoming week.  Did have bleeding between menses; not currently sexually active.  Pain worsens with bending.  Back also hurting.  Needs to see gynecologist for these acute issues; plans to call for an appointment.  Has started physical therapy for back pain/sprain.  Last Wednesday unable to move.  Sent in last week.  Called physical therapy last week with heat and massage.  Returned 48 hours later with improvement. No radiation into legs; no n/t/w.  No saddle paresthesias; no b/b dysfunction.  Rash on lower back: cannot see.  Heat bumps.  Has blood pressure cuff at home; ranging stable.  Diastolic 120/90.  Not happy with persistently elevated diastolic readings.   There is no immunization history on file for this patient. BP Readings from Last 3 Encounters:  07/07/16 126/84  05/21/16 114/86  05/08/16 135/96   Wt Readings from Last 3 Encounters:  07/07/16 223 lb (101.2 kg)  05/21/16 238 lb (108 kg)  05/04/16 250 lb (113.4 kg)   Has been doing [employer]; true weight is 233.  When weighed in at rheumatology, was 234.4.  Ruled out autoimmune process.  s/p cervical spine xray.  Provided with CD of cervical spine; normal xray.  Spoke with PA  and MD; both felt neurological or nerve compression.  Also having legs aching legs B. LEFT foot will go numb so often.  Cannot tolerate prolonged sitting; must change positions due to tingling.  No weakness in upper body.  Heavy feeling.  When starts, will clench fist with improvement.  Fifth digit will get numb.  Distracts self.Thirty minutes later, numbness is better.  Chronic issues occurring for two years.  L hand issue has been persistent.  Three sessions of physical therapy.  Neck area is very tight.     Review of Systems  Constitutional: Negative for chills, diaphoresis, fatigue and fever.  HENT: Positive for congestion, postnasal drip and rhinorrhea. Negative for sneezing, sore throat, tinnitus, trouble swallowing and voice change.   Eyes: Negative for visual disturbance.  Respiratory: Negative for cough and shortness of breath.   Cardiovascular: Negative for chest pain, palpitations and leg swelling.  Gastrointestinal: Positive for abdominal pain and nausea. Negative for constipation, diarrhea and vomiting.  Endocrine: Negative for cold intolerance, heat intolerance, polydipsia, polyphagia and polyuria.  Genitourinary: Positive for menstrual problem and pelvic pain. Negative for dysuria, flank pain, frequency, genital sores, hematuria, urgency, vaginal discharge and vaginal pain.  Musculoskeletal: Positive for back pain.  Neurological: Positive for numbness. Negative for dizziness, tremors, seizures, syncope, facial asymmetry, speech difficulty, weakness, light-headedness and headaches.    Past Medical History:  Diagnosis Date  . Allergy   . Anemia   . Back pain   .  Chronic headaches   . Eczema   . Hypertension   . Vision changes    Past Surgical History:  Procedure Laterality Date  . CERVICAL POLYPECTOMY  2013  . DILATION AND CURETTAGE OF UTERUS  2013   No Known Allergies Current Outpatient Prescriptions  Medication Sig Dispense Refill  . amLODipine (NORVASC) 10 MG tablet  Take 1 tablet (10 mg total) by mouth daily. 90 tablet 1  . clobetasol ointment (TEMOVATE) 0.05 % APPLY  OINTMENT TOPICALLY TWICE DAILY 60 g 0  . diclofenac (VOLTAREN) 50 MG EC tablet Take 1 tablet (50 mg total) by mouth 2 (two) times daily. 14 tablet 0  . LORazepam (ATIVAN) 2 MG tablet Take 1 tablet (2 mg total) by mouth every 6 (six) hours as needed for anxiety. 1 tablet 0   No current facility-administered medications for this visit.    Social History   Social History  . Marital status: Single    Spouse name: N/A  . Number of children: N/A  . Years of education: graduate   Occupational History  . researcher [employer]   Social History Main Topics  . Smoking status: Never Smoker  . Smokeless tobacco: Never Used  . Alcohol use 0.0 oz/week     Comment: socially  . Drug use: No  . Sexual activity: Not on file   Other Topics Concern  . Not on file   Social History Narrative   Marital status: single; not dating.      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      Lives: alone; family in [place] and [place].               Family History  Problem Relation Age of Onset  . Esophageal cancer Father     and throat cancer  . Breast cancer Mother 8    breast cancer  . Colon cancer Maternal Grandfather   . Dementia Maternal Grandmother   . Heart attack Cousin        Objective:    BP 126/84   Pulse 90   Temp 98.2 F (36.8 C) (Oral)   Resp 16   Ht  (1.727 m)   Wt 223 lb (101.2 kg)   SpO2 100%   BMI 33.91 kg/m  Physical Exam  Constitutional: She is oriented to person, place, and time. She appears well-developed and well-nourished. No distress.  HENT:  Head: Normocephalic and atraumatic.  Right Ear: External ear normal.  Left Ear: External ear normal.  Nose: Nose normal.  Mouth/Throat: Oropharynx is clear and moist.  Eyes: Conjunctivae and  EOM are normal. Pupils are equal, round, and reactive to light.  Neck: Normal range of motion. Neck supple. Carotid bruit is not present. No thyromegaly present.  Cardiovascular: Normal rate, regular rhythm, normal heart sounds and intact distal pulses.  Exam reveals no gallop and no friction rub.   No murmur heard. Pulmonary/Chest: Effort normal and breath sounds normal. She has no wheezes. She has no rales.  Abdominal: Soft. Bowel sounds are normal. She exhibits no distension and no mass. There is no tenderness. There is no rebound and no guarding.  Musculoskeletal:       Right shoulder: Normal.       Left shoulder: Normal.       Cervical back: She  exhibits pain and spasm. She exhibits normal range of motion, no tenderness and no bony tenderness.       Lumbar back: She exhibits pain and spasm. She exhibits normal range of motion, no tenderness, no bony tenderness and normal pulse.  Lymphadenopathy:    She has no cervical adenopathy.  Neurological: She is alert and oriented to person, place, and time. No cranial nerve deficit.  Skin: Skin is warm and dry. No rash noted. She is not diaphoretic. No erythema. No pallor.  Psychiatric: She has a normal mood and affect. Her behavior is normal.    Depression screen [facility] 2/9 07/07/2016 05/04/2016 02/17/2016 12/27/2015 09/02/2015  Decreased Interest 0 0 0 0 0  Down, Depressed, Hopeless 0 0 0 0 0  PHQ - 2 Score 0 0 0 0 0  Altered sleeping - - - - -  Tired, decreased energy - - - - -  Change in appetite - - - - -  Feeling bad or failure about yourself  - - - - -  Trouble concentrating - - - - -  Moving slowly or fidgety/restless - - - - -  Suicidal thoughts - - - - -  PHQ-9 Score - - - - -       Assessment & Plan:   1. Urinary body odor   2. Abdominal pain, right upper quadrant   3. Essential hypertension   4. Seasonal allergic rhinitis due to pollen   5. Atypical chest pain   6. Generalized anxiety disorder   7. Chronic bilateral low back  pain without sciatica   8. Neck sprain, subsequent encounter   9. Abnormal vaginal bleeding   10. Class 1 obesity due to excess calories with serious comorbidity and body mass index (BMI) of 33.0 to 33.9 in adult    -new onset of odor to urine with RUQ pain; obtain urine with urine culture; BRAT diet, hydration. If persists, obtain abdominal US.   -moderate control of blood pressure; continue Amlodipine  daily; continue with weight loss and exercise; congratulations on weight loss. -desires to undergo echo at cardiology facility to associated with hospital system; will coordinate if possible with Dr. Verl Dicker office. -undergoing physical therapy for lower back pain at [facility]; continue treatment plan. -recent intermenstrual vaginal bleeding; advised to follow-up with gynecology. -pt desires referral to ENT; unable to make appointment in the past; recommend calling to reschedule appointment.  I have referred to ENT twice in the past six months, thus, recommend pt contact office to schedule office visit when she is ready.    Orders Placed This Encounter  Procedures  . Urine culture  . Urine Microscopic  . POCT urinalysis dipstick   No orders of the defined types were placed in this encounter.   Return in about 2 months (around 09/06/2016) for recheck.   Quavion Boule Paulita Fujita, M.D. Primary Care at [facility] previously [facility] [address] [place], [place]  [address] [phone] phone [phone] fax

## 2016-07-07 NOTE — Patient Instructions (Signed)
     IF you received an x-ray today, you will receive an invoice from [facility]. Please contact [facility] at [phone] with questions or concerns regarding your invoice.   IF you received labwork today, you will receive an invoice from [facility]. Please contact [facility] at [phone] with questions or concerns regarding your invoice.   Our billing staff will not be able to assist you with questions regarding bills from these companies.  You will be contacted with the lab results as soon as they are available. The fastest way to get your results is to activate your My Chart account. Instructions are located on the last page of this paperwork. If you have not heard from us regarding the results in 2 weeks, please contact this office.     

## 2016-07-08 LAB — URINALYSIS, MICROSCOPIC ONLY
CASTS: NONE SEEN /LPF
EPITHELIAL CELLS (NON RENAL): NONE SEEN /HPF (ref 0–10)

## 2016-07-08 LAB — URINE CULTURE

## 2016-07-12 ENCOUNTER — Ambulatory Visit: Payer: BC Managed Care – PPO | Admitting: Physician Assistant

## 2016-07-21 ENCOUNTER — Telehealth (HOSPITAL_COMMUNITY): Payer: Self-pay | Admitting: Radiology

## 2016-07-21 NOTE — Telephone Encounter (Signed)
Called to schedule echocardiogram 

## 2016-08-02 DIAGNOSIS — E6609 Other obesity due to excess calories: Secondary | ICD-10-CM | POA: Insufficient documentation

## 2016-08-02 DIAGNOSIS — Z6833 Body mass index (BMI) 33.0-33.9, adult: Secondary | ICD-10-CM

## 2016-08-31 ENCOUNTER — Telehealth (HOSPITAL_COMMUNITY): Payer: Self-pay | Admitting: Cardiovascular Disease

## 2016-08-31 NOTE — Telephone Encounter (Signed)
I called patient to see if she wanted to r/s her echocardiogram and she voiced that the test was too expensive with her insurance.

## 2016-09-02 ENCOUNTER — Encounter: Payer: Self-pay | Admitting: Family Medicine

## 2016-09-02 ENCOUNTER — Ambulatory Visit (INDEPENDENT_AMBULATORY_CARE_PROVIDER_SITE_OTHER): Payer: BC Managed Care – PPO | Admitting: Family Medicine

## 2016-09-02 VITALS — BP 131/84 | HR 85 | Temp 98.2°F | Resp 18 | Ht 69.49 in | Wt 236.0 lb

## 2016-09-02 DIAGNOSIS — E6609 Other obesity due to excess calories: Secondary | ICD-10-CM | POA: Diagnosis not present

## 2016-09-02 DIAGNOSIS — F411 Generalized anxiety disorder: Secondary | ICD-10-CM | POA: Diagnosis not present

## 2016-09-02 DIAGNOSIS — N926 Irregular menstruation, unspecified: Secondary | ICD-10-CM

## 2016-09-02 DIAGNOSIS — I10 Essential (primary) hypertension: Secondary | ICD-10-CM | POA: Diagnosis not present

## 2016-09-02 DIAGNOSIS — M899 Disorder of bone, unspecified: Secondary | ICD-10-CM

## 2016-09-02 DIAGNOSIS — M940 Chondrocostal junction syndrome [Tietze]: Secondary | ICD-10-CM

## 2016-09-02 DIAGNOSIS — Z6833 Body mass index (BMI) 33.0-33.9, adult: Secondary | ICD-10-CM

## 2016-09-02 MED ORDER — AMLODIPINE BESYLATE 10 MG PO TABS: 10.0000 mg | ORAL_TABLET | Freq: Every day | ORAL | 3 refills | Status: DC

## 2016-09-02 NOTE — Progress Notes (Signed)
Subjective:    Patient ID: Amanda Haley, female    DOB: 05-27-1975, 41 y.o.   MRN: [id number]  09/02/2016  Follow-up   HPI This 41 y.o. female presents for evaluation of hypertension, costochondritis, cervical sprain, anxiety, obesity.    Costochondritis and L arm numbness: continuing physical therapy; going to change treatment due to lack of improvement.  If presses in middle of upper LEFT arm; feels like a pinching sensation that radiates from back to chest.  Flying to [facility] tomorrow for work.  Grades AP exams.  Similar issues for the past year and has not had any issues.  Pt reports she has scars on L anterior chest and L upper back from grabbing skin at site of pain.  Accepted a new job position; has another appointment in June 2018.  Should be able to carry insurance through July.  Yet, appointment is at end of June; also has a Futures trader.  May need to reschedule.  Kept appointment on books just in case.  Moving to [place] to [employer].  Big decision.  Would be going up for tenur this fall.  Establishing with new providers in [place].  Insurance with new provider in July 2018.  [employer] is associated with BCBS. Did not get echo; will be taking more of premium with new job.    Hypertension: blood pressure this weekend and 120/88. Taking Amlodipine 10mg  daily.  Had dialed back a little to 7.5mg .  Amenorrhea: missed period.  Needs to schedule gyno exam.  Not sure if stressed.  Not sexually active.  Spotted after 19 days; that has occurred before in the past.  Having some leg pain.  Has googled symptoms.  Last gynecological exam a while ago.   Will start moving in July 2018.     BP Readings from Last 3 Encounters:  09/02/16 131/84  07/07/16 126/84  05/21/16 114/86   Wt Readings from Last 3 Encounters:  09/02/16 236 lb (107 kg)  07/07/16 223 lb (101.2 kg)  05/21/16 238 lb (108 kg)     There is no immunization history on file for this patient.    Review of Systems    Constitutional: Negative for chills, diaphoresis, fatigue and fever.  Eyes: Negative for visual disturbance.  Respiratory: Negative for cough and shortness of breath.   Cardiovascular: Negative for chest pain, palpitations and leg swelling.  Gastrointestinal: Negative for abdominal pain, constipation, diarrhea, nausea and vomiting.  Endocrine: Negative for cold intolerance, heat intolerance, polydipsia, polyphagia and polyuria.  Genitourinary: Positive for menstrual problem. Negative for pelvic pain.  Musculoskeletal: Positive for arthralgias, myalgias, neck pain and neck stiffness.  Neurological: Negative for dizziness, tremors, seizures, syncope, facial asymmetry, speech difficulty, weakness, light-headedness, numbness and headaches.  Psychiatric/Behavioral: The patient is nervous/anxious.     Past Medical History:  Diagnosis Date  . Allergy   . Anemia   . Back pain   . Chronic headaches   . Eczema   . Hypertension   . Vision changes    Past Surgical History:  Procedure Laterality Date  . CERVICAL POLYPECTOMY  2013  . DILATION AND CURETTAGE OF UTERUS  2013   No Known Allergies Current Outpatient Prescriptions  Medication Sig Dispense Refill  . amLODipine (NORVASC) 10 MG tablet Take 1 tablet (10 mg total) by mouth daily. 90 tablet 3  . clobetasol ointment (TEMOVATE) 0.05 % APPLY  OINTMENT TOPICALLY TWICE DAILY 60 g 0   No current facility-administered medications for this visit.    Social  History   Social History  . Marital status: Single    Spouse name: N/A  . Number of children: N/A  . 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 Smoking status: Never Smoker  . Smokeless tobacco: Never Used  . Alcohol use 0.0 oz/week     Comment: socially  . Drug use: No  . Sexual activity: Not on file   Other Topics Concern  . Not on file   Social History Narrative   Marital status:  single; not dating.      Children: none      Em[place] 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      Lives: alone; family in [place].               Family History  Problem Relation Age of Onset  . Esophageal cancer Father        and throat cancer  . Breast cancer Mother 5442       breast cancer  . Colon cancer Maternal Grandfather   . Dementia Maternal Grandmother   . Heart attack Cousin        Objective:    BP 131/84   Pulse 85   Temp 98.2 F (36.8 C) (Oral)   Resp 18   Ht 5' 9.49" (1.765 m)   Wt 236 lb (107 kg)   LMP 08/03/2016 (Approximate)   SpO2 99%   BMI 34.36 kg/m  Physical Exam  Constitutional: She is oriented to person, place, and time. She appears well-developed and well-nourished. No distress.  HENT:  Head: Normocephalic and atraumatic.  Right Ear: External ear normal.  Left Ear: External ear normal.  Nose: Nose normal.  Mouth/Throat: Oropharynx is clear and moist.  Eyes: Conjunctivae and EOM are normal. Pupils are equal, round, and reactive to light.  Neck: Normal range of motion. Neck supple. Carotid bruit is not present. No thyromegaly present.  Cardiovascular: Normal rate, regular rhythm, normal heart sounds and intact distal pulses.  Exam reveals no gallop and no friction rub.   No murmur heard. Pulmonary/Chest: Effort normal and breath sounds normal. She has no wheezes. She has no rales.  Abdominal: Soft. Bowel sounds are normal. She exhibits no distension and no mass. There is no tenderness. There is no rebound and no guarding.  Musculoskeletal:       Right shoulder: Normal.       Left shoulder: Normal.       Cervical back: She exhibits tenderness, pain and spasm. She exhibits normal range of motion and no bony tenderness.  Cervical spine: non-tender midline; +tender paraspinal regions L and L trapezius region and scapular region; full ROM cervical spine without limitation.   Motor 5/5 BUE.  Grip 5/5.  Pain reproduced with range of motion of neck.     Lymphadenopathy:    She has no cervical adenopathy.  Neurological: She is alert and oriented to person, place, and time. No cranial nerve deficit. She exhibits normal muscle tone. Coordination normal.  Skin: Skin is warm and dry. No rash noted. She is not diaphoretic. No erythema. No pallor.  +scars on anterior L chest.  Psychiatric: She has a normal mood and affect. Her behavior is normal.   Results for orders placed or performed in visit on 08/03/16  [facility] MAMMOGRAPHY  Result Value Ref Range   HM Mammogram 0-4 Bi-Rad 0-4 Bi-Rad, Self Reported Normal       Assessment & Plan:   1. Essential hypertension, benign   2. Generalized anxiety disorder   3. Scapular dysfunction   4. Costochondritis   5. Class 1 obesity due to excess calories with serious comorbidity and body mass index (BMI) of 33.0 to 33.9 in adult   6. Irregular menstrual cycle    -moderately controlled hypertension yet pt reports taking Amlodipine 7.5mg  daily; recommend increasing to 10mg  daily with upcoming move and transition to [place]. -refer to Sports Medicine for cervical sprain and scapular sprain; continue physical therapy. -non-compliant with echo per cardiology for chest pain; financial restrictions at this time. -reports new onset irregular menses this month; recommend monitoring and follow-up with gynecology if persists.   Orders Placed This Encounter  Procedures  . Ambulatory referral to Sports Medicine    Referral Priority:   Routine    Referral Type:   Consultation    Referred to Provider:   Antoine Primas, MD    Number of Visits Requested:   1   Meds ordered this encounter  Medications  . amLODipine (NORVASC) 10 MG tablet    Sig: Take 1 tablet (10 mg total) by mouth daily.    Dispense:  90 tablet    Refill:  3    NEW INCREASED DOSE. DC ANY REFILLS LEFT OF 2.5 AND 5 MG ON FILE.    No Follow-up on file.   Addisson Frate  Paulita Fujita, M.D. Primary Care at [facility] previously [facility] [address] [place], [place]  [address] [phone] phone [phone] fax

## 2016-09-02 NOTE — Patient Instructions (Addendum)
   Try lidocaine/lidoderm patches 4%    IF you received an x-ray today, you will receive an invoice from [facility]. Please contact [facility] at [phone] with questions or concerns regarding your invoice.   IF you received labwork today, you will receive an invoice from [facility]. Please contact [facility] at [phone] with questions or concerns regarding your invoice.   Our billing staff will not be able to assist you with questions regarding bills from these companies.  You will be contacted with the lab results as soon as they are available. The fastest way to get your results is to activate your My Chart account. Instructions are located on the last page of this paperwork. If you have not heard from us regarding the results in 2 weeks, please contact this office.

## 2016-09-20 IMAGING — CR DG ABDOMEN 1V
1 series · 1 of 1 positions shown · non-contrast
Comparison: CT abdomen and pelvis August 23, 2013

CLINICAL DATA: Abdominal pain for 1 day

EXAM:
ABDOMEN - 1 VIEW

[AP]
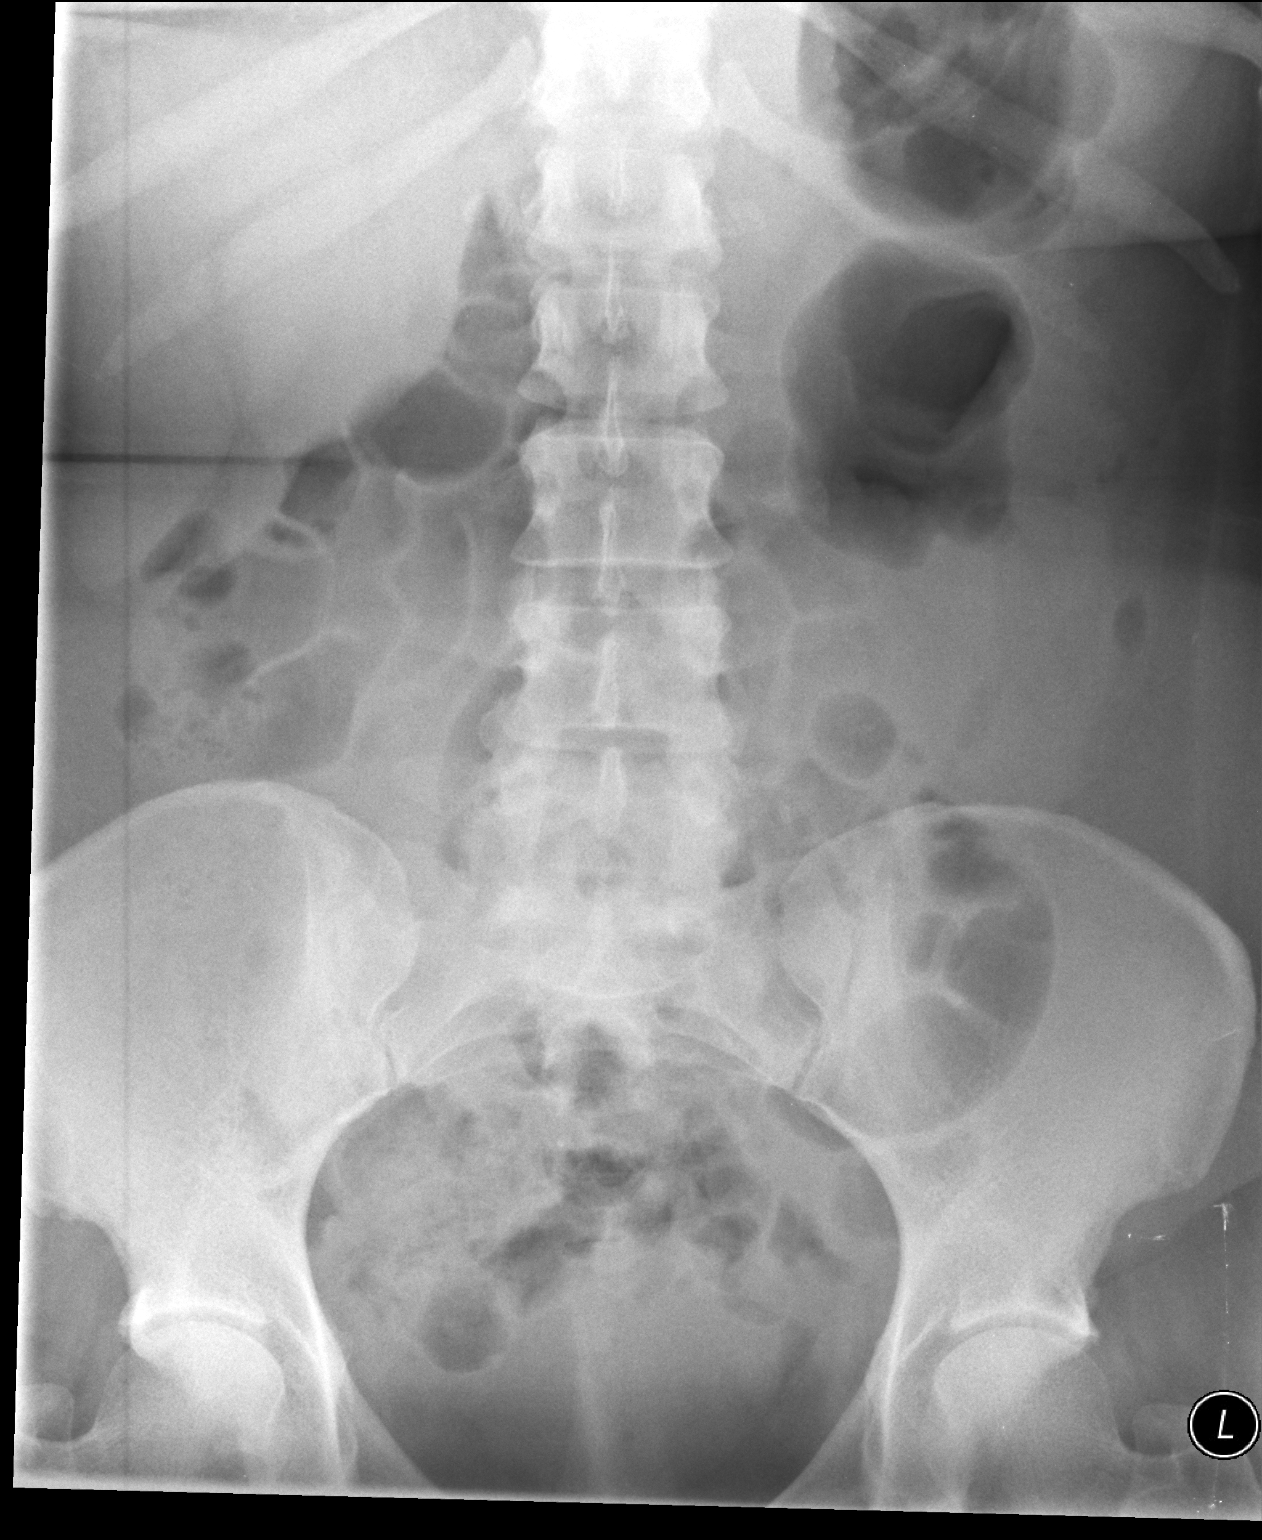

[1 of 1 positions shown; findings below may reference images not displayed]

FINDINGS: Bowel gas pattern is unremarkable. No bowel obstruction or free air
seen. There is moderate stool in the colon. No abnormal
calcifications.
IMPRESSION: Bowel gas pattern unremarkable. No demonstrable bowel obstruction or
free air.

## 2016-09-21 ENCOUNTER — Encounter: Payer: Self-pay | Admitting: Neurology

## 2016-09-28 ENCOUNTER — Ambulatory Visit: Payer: BC Managed Care – PPO | Admitting: Family Medicine

## 2016-10-04 ENCOUNTER — Encounter: Payer: Self-pay | Admitting: Family Medicine

## 2016-10-04 ENCOUNTER — Ambulatory Visit (INDEPENDENT_AMBULATORY_CARE_PROVIDER_SITE_OTHER): Payer: BC Managed Care – PPO | Admitting: Family Medicine

## 2016-10-04 VITALS — BP 130/89 | HR 89 | Temp 98.0°F | Resp 18 | Ht 70.47 in | Wt 239.0 lb

## 2016-10-04 DIAGNOSIS — H6981 Other specified disorders of Eustachian tube, right ear: Secondary | ICD-10-CM | POA: Diagnosis not present

## 2016-10-04 DIAGNOSIS — I10 Essential (primary) hypertension: Secondary | ICD-10-CM | POA: Diagnosis not present

## 2016-10-04 DIAGNOSIS — H9201 Otalgia, right ear: Secondary | ICD-10-CM | POA: Diagnosis not present

## 2016-10-04 DIAGNOSIS — W57XXXA Bitten or stung by nonvenomous insect and other nonvenomous arthropods, initial encounter: Secondary | ICD-10-CM

## 2016-10-04 DIAGNOSIS — T753XXA Motion sickness, initial encounter: Secondary | ICD-10-CM

## 2016-10-04 MED ORDER — AMLODIPINE BESYLATE 2.5 MG PO TABS: 7.5000 mg | ORAL_TABLET | Freq: Every day | ORAL | 3 refills | Status: DC

## 2016-10-04 MED ORDER — SCOPOLAMINE 1 MG/3DAYS TD PT72: 1.0000 | MEDICATED_PATCH | TRANSDERMAL | 0 refills | Status: DC

## 2016-10-04 NOTE — Progress Notes (Signed)
Subjective:    Patient ID: Amanda Haley, female    DOB: July 25, 1975, 41 y.o.   MRN: [id number]  10/04/2016  Hypertension   HPI This 41 y.o. female presents for evaluation of hypertension.  Management changes at last visit included increasing to Amlodipine 10mg  daily.   Going to [place] and [address], [place], and [place].  Hope to wean medication with weight loss.  Intermittent ear pain: went to [facility].  When yawns, R ear popping and pain. Continued for one week and then resolved.  With deep yawn and persistent.    Insect bites: along feet and legs L>R.  Thinks likely sand fleas.   DUB: started on LoLoestrin fro two months and then reassess.    BP Readings from Last 3 Encounters:  10/04/16 130/89  09/02/16 131/84  07/07/16 126/84   Wt Readings from Last 3 Encounters:  10/04/16 239 lb (108.4 kg)  09/02/16 236 lb (107 kg)  07/07/16 223 lb (101.2 kg)    There is no immunization history on file for this patient.  Review of Systems  Constitutional: Negative for chills, diaphoresis, fatigue and fever.  HENT: Positive for ear pain, postnasal drip and rhinorrhea. Negative for congestion, ear discharge, sinus pain, sinus pressure, sore throat, tinnitus, trouble swallowing and voice change.   Eyes: Negative for visual disturbance.  Respiratory: Negative for cough and shortness of breath.   Cardiovascular: Negative for chest pain, palpitations and leg swelling.  Gastrointestinal: Negative for abdominal pain, constipation, diarrhea, nausea and vomiting.  Endocrine: Negative for cold intolerance, heat intolerance, polydipsia, polyphagia and polyuria.  Genitourinary: Positive for vaginal bleeding.  Musculoskeletal: Positive for arthralgias, myalgias, neck pain and neck stiffness.  Skin: Positive for rash and wound.  Neurological: Negative for dizziness, tremors, seizures, syncope, facial asymmetry, speech difficulty, weakness, light-headedness, numbness and headaches.  Psychiatric/Behavioral:  Negative for dysphoric mood, self-injury, sleep disturbance and suicidal ideas. The patient is nervous/anxious.     Past Medical History:  Diagnosis Date  . Allergy   . Anemia   . Back pain   . Chronic headaches   . Eczema   . Hypertension   . Vision changes    Past Surgical History:  Procedure Laterality Date  . CERVICAL POLYPECTOMY  2013  . DILATION AND CURETTAGE OF UTERUS  2013   No Known Allergies Current Outpatient Prescriptions  Medication Sig Dispense Refill  . amLODipine (NORVASC) 10 MG tablet Take 1 tablet (10 mg total) by mouth daily. 90 tablet 3  . clobetasol ointment (TEMOVATE) 0.05 % APPLY  OINTMENT TOPICALLY TWICE DAILY 60 g 0  . amLODipine (NORVASC) 2.5 MG tablet Take 3 tablets (7.5 mg total) by mouth daily. 90 tablet 3  . scopolamine (TRANSDERM-SCOP) 1 MG/3DAYS Place 1 patch (1.5 mg total) onto the skin every 3 (three) days. 4 patch 0   No current facility-administered medications for this visit.    Social History   Social History  . Marital status: Single    Spouse name: N/A  . Number of children: N/A  . Years of education: graduate   Occupational History  . researcher [employer]   Social History Main Topics  . Smoking status: Never Smoker  . Smokeless tobacco: Never Used  . Alcohol use 0.0 oz/week     Comment: socially  . Drug use: No  . Sexual activity: Not on file   Other Topics Concern  . Not on file   Social History Narrative   Marital  status: single; not dating.      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      Lives: alone; family in [place] and [place].               Family History  Problem Relation Age of Onset  . Esophageal cancer Father        and throat cancer  . Breast cancer Mother 70       breast cancer  . Colon cancer Maternal Grandfather   . Dementia Maternal Grandmother   . Heart attack Cousin         Objective:    BP 130/89   Pulse 89   Temp 98 F (36.7 C) (Oral)   Resp 18   Ht 5' 10.47" (1.79 m)   Wt 239 lb (108.4 kg)   LMP 10/04/2016   SpO2 100%   BMI 33.84 kg/m  Physical Exam  Constitutional: She is oriented to person, place, and time. She appears well-developed and well-nourished. No distress.  HENT:  Head: Normocephalic and atraumatic.  Right Ear: Tympanic membrane, external ear and ear canal normal.  Left Ear: Tympanic membrane, external ear and ear canal normal.  Nose: Mucosal edema and rhinorrhea present. Right sinus exhibits no frontal sinus tenderness. Left sinus exhibits no maxillary sinus tenderness and no frontal sinus tenderness.  Mouth/Throat: Oropharynx is clear and moist.  Eyes: Conjunctivae and EOM are normal. Pupils are equal, round, and reactive to light.  Neck: Normal range of motion. Neck supple. Carotid bruit is not present. No thyromegaly present.  Cardiovascular: Normal rate, regular rhythm, normal heart sounds and intact distal pulses.  Exam reveals no gallop and no friction rub.   No murmur heard. Pulmonary/Chest: Effort normal and breath sounds normal. She has no wheezes. She has no rales.  Abdominal: Soft. Bowel sounds are normal. She exhibits no distension and no mass. There is no tenderness. There is no rebound and no guarding.  Lymphadenopathy:    She has no cervical adenopathy.  Neurological: She is alert and oriented to person, place, and time. No cranial nerve deficit.  Skin: Skin is warm and dry. No rash noted. She is not diaphoretic. No erythema. No pallor.  Healing insect bites B lower extremities.  Psychiatric: She has a normal mood and affect. Her behavior is normal.   Results for orders placed or performed in visit on 08/03/16  HM MAMMOGRAPHY  Result Value Ref Range   HM Mammogram 0-4 Bi-Rad 0-4 Bi-Rad, Self Reported Normal       Assessment & Plan:   1. Essential hypertension, benign   2. Referred otalgia of right ear    3. Insect bite, initial encounter   4. Motion sickness, initial encounter   5. Dysfunction of right eustachian tube    -controlled blood pressure; refills provided for Amlodipine; encourage exercise, weight loss, sodium restriction. -R eustachian tube dysfunction; recommend continuing oral antihistamine and nasal steroid/flonase; no evidence of otitis media. -new onset insect bites on lower extremities; local wound care.  Can apply topical steroid; continue oral antihistamine; insect repellant for remainder of season. -upcoming cruise.  Rx for scopolamine patches provided. -moving to [place] with new job; recommend establishing with new PCP in upcoming six months.  No orders of the defined types were placed in this encounter.  Meds ordered this encounter  Medications  . scopolamine (TRANSDERM-SCOP)  1 MG/3DAYS    Sig: Place 1 patch (1.5 mg total) onto the skin every 3 (three) days.    Dispense:  4 patch    Refill:  0  . amLODipine (NORVASC) 2.5 MG tablet    Sig: Take 3 tablets (7.5 mg total) by mouth daily.    Dispense:  90 tablet    Refill:  3    No Follow-up on file.   Antione Obar Paulita FujitaMartin Izik Bingman, M.D. [facility]  [facility] [address] [place], [place]  [id number] [phone] phone [phone] fax

## 2016-10-04 NOTE — Patient Instructions (Signed)
     IF you received an x-ray today, you will receive an invoice from [facility]. Please contact [facility] at [phone] with questions or concerns regarding your invoice.   IF you received labwork today, you will receive an invoice from [facility]. Please contact [facility] at [phone] with questions or concerns regarding your invoice.   Our billing staff will not be able to assist you with questions regarding bills from these companies.  You will be contacted with the lab results as soon as they are available. The fastest way to get your results is to activate your My Chart account. Instructions are located on the last page of this paperwork. If you have not heard from us regarding the results in 2 weeks, please contact this office.     

## 2016-10-16 ENCOUNTER — Encounter: Payer: Self-pay | Admitting: Family Medicine

## 2016-11-26 ENCOUNTER — Ambulatory Visit: Payer: Self-pay | Admitting: Family Medicine

## 2016-11-27 ENCOUNTER — Ambulatory Visit (INDEPENDENT_AMBULATORY_CARE_PROVIDER_SITE_OTHER): Payer: BLUE CROSS/BLUE SHIELD | Admitting: Family Medicine

## 2016-11-27 ENCOUNTER — Encounter: Payer: Self-pay | Admitting: Family Medicine

## 2016-11-27 VITALS — BP 123/88 | HR 85 | Temp 98.0°F | Resp 16 | Ht 69.5 in | Wt 240.1 lb

## 2016-11-27 DIAGNOSIS — I10 Essential (primary) hypertension: Secondary | ICD-10-CM | POA: Diagnosis not present

## 2016-11-27 DIAGNOSIS — Z5181 Encounter for therapeutic drug level monitoring: Secondary | ICD-10-CM | POA: Diagnosis not present

## 2016-11-27 DIAGNOSIS — R600 Localized edema: Secondary | ICD-10-CM

## 2016-11-27 MED ORDER — LISINOPRIL-HYDROCHLOROTHIAZIDE 20-25 MG PO TABS: 1.0000 | ORAL_TABLET | Freq: Every day | ORAL | 0 refills | Status: DC

## 2016-11-27 MED ORDER — LISINOPRIL-HYDROCHLOROTHIAZIDE 20-25 MG PO TABS: 1.0000 | ORAL_TABLET | Freq: Every day | ORAL | Status: DC

## 2016-11-27 NOTE — Patient Instructions (Addendum)
IF you received an x-ray today, you will receive an invoice from [facility]. Please contact [facility] at [phone] with questions or concerns regarding your invoice.   IF you received labwork today, you will receive an invoice from [facility]. Please contact [facility] at [phone] with questions or concerns regarding your invoice.   Our billing staff will not be able to assist you with questions regarding bills from these companies.  You will be contacted with the lab results as soon as they are available. The fastest way to get your results is to activate your My Chart account. Instructions are located on the last page of this paperwork. If you have not heard from [place] regarding the results in 2 weeks, please contact this office.     Peripheral Edema Peripheral edema is swelling that is caused by a buildup of fluid. Peripheral edema most often affects the lower legs, ankles, and feet. It can also develop in the arms, hands, and face. The area of the body that has peripheral edema will look swollen. It may also feel heavy or warm. Your clothes may start to feel tight. Pressing on the area may make a temporary dent in your skin. You may not be able to move your arm or leg as much as usual. There are many causes of peripheral edema. It can be a complication of other diseases, such as congestive heart failure, kidney disease, or a problem with your blood circulation. It also can be a side effect of certain medicines. It often happens to women during pregnancy. Sometimes, the cause is not known. Treating the underlying condition is often the only treatment for peripheral edema. Follow these instructions at home: Pay attention to any changes in your symptoms. Take these actions to help with your discomfort:  Raise (elevate) your legs while you are sitting or lying down.  Move around often to prevent stiffness and to lessen swelling. Do not sit or stand for long periods of  time.  Wear support stockings as told by your health care provider.  Follow instructions from your health care provider about limiting salt (sodium) in your diet. Sometimes eating less salt can reduce swelling.  Take over-the-counter and prescription medicines only as told by your health care provider. Your health care provider may prescribe medicine to help your body get rid of excess water (diuretic).  Keep all follow-up visits as told by your health care provider. This is important.  Contact a health care provider if:  You have a fever.  Your edema starts suddenly or is getting worse, especially if you are pregnant or have a medical condition.  You have swelling in only one leg.  You have increased swelling and pain in your legs. Get help right away if:  You develop shortness of breath, especially when you are lying down.  You have pain in your chest or abdomen.  You feel weak.  You faint. This information is not intended to replace advice given to you by your health care provider. Make sure you discuss any questions you have with your health care provider. Document Released: 04/29/2004 Document Revised: 08/25/2015 Document Reviewed: 10/02/2014 Elsevier Interactive Patient Education  2018 Elsevier Inc.  Potassium Content of Foods Potassium is a mineral found in many foods and drinks. It helps keep fluids and minerals balanced in your body and affects how steadily your heart beats. Potassium also helps control your blood pressure and keep your muscles and nervous system healthy. Certain health conditions and  medicines may change the balance of potassium in your body. When this happens, you can help balance your level of potassium through the foods that you do or do not eat. Your health care provider or dietitian may recommend an amount of potassium that you should have each day. The following lists of foods provide the amount of potassium (in parentheses) per serving in each  item. High in potassium The following foods and beverages have 200 mg or more of potassium per serving:  Apricots, 2 raw or 5 dry (200 mg).  Artichoke, 1 medium (345 mg).  Avocado, raw,  each (245 mg).  Banana, 1 medium (425 mg).  Beans, lima, or baked beans, canned,  cup (280 mg).  Beans, white, canned,  cup (595 mg).  Beef roast, 3 oz (320 mg).  Beef, ground, 3 oz (270 mg).  Beets, raw or cooked,  cup (260 mg).  Bran muffin, 2 oz (300 mg).  Broccoli,  cup (230 mg).  Brussels sprouts,  cup (250 mg).  Cantaloupe,  cup (215 mg).  Cereal, 100% bran,  cup (200-400 mg).  Cheeseburger, single, fast food, 1 each (225-400 mg).  Chicken, 3 oz (220 mg).  Clams, canned, 3 oz (535 mg).  Crab, 3 oz (225 mg).  Dates, 5 each (270 mg).  Dried beans and peas,  cup (300-475 mg).  Figs, dried, 2 each (260 mg).  Fish: halibut, tuna, cod, snapper, 3 oz (480 mg).  Fish: salmon, haddock, swordfish, perch, 3 oz (300 mg).  Fish, tuna, canned 3 oz (200 mg).  [place] fries, fast food, 3 oz (470 mg).  Granola with fruit and nuts,  cup (200 mg).  Grapefruit juice,  cup (200 mg).  Greens, beet,  cup (655 mg).  Honeydew melon,  cup (200 mg).  Kale, raw, 1 cup (300 mg).  Kiwi, 1 medium (240 mg).  Kohlrabi, rutabaga, parsnips,  cup (280 mg).  Lentils,  cup (365 mg).  Mango, 1 each (325 mg).  Milk, chocolate, 1 cup (420 mg).  Milk: nonfat, low-fat, whole, buttermilk, 1 cup (350-380 mg).  Molasses, 1 Tbsp (295 mg).  Mushrooms,  cup (280) mg.  Nectarine, 1 each (275 mg).  Nuts: almonds, peanuts, hazelnuts, [place], cashew, mixed, 1 oz (200 mg).  Nuts, pistachios, 1 oz (295 mg).  Orange, 1 each (240 mg).  Orange juice,  cup (235 mg).  Papaya, medium,  fruit (390 mg).  Peanut butter, chunky, 2 Tbsp (240 mg).  Peanut butter, smooth, 2 Tbsp (210 mg).  Pear, 1 medium (200 mg).  Pomegranate, 1 whole (400 mg).  Pomegranate juice,  cup (215  mg).  Pork, 3 oz (350 mg).  Potato chips, salted, 1 oz (465 mg).  Potato, baked with skin, 1 medium (925 mg).  Potatoes, boiled,  cup (255 mg).  Potatoes, mashed,  cup (330 mg).  Prune juice,  cup (370 mg).  Prunes, 5 each (305 mg).  Pudding, chocolate,  cup (230 mg).  Pumpkin, canned,  cup (250 mg).  Raisins, seedless,  cup (270 mg).  Seeds, sunflower or pumpkin, 1 oz (240 mg).  Soy milk, 1 cup (300 mg).  Spinach,  cup (420 mg).  Spinach, canned,  cup (370 mg).  Sweet potato, baked with skin, 1 medium (450 mg).  Swiss chard,  cup (480 mg).  Tomato or vegetable juice,  cup (275 mg).  Tomato sauce or puree,  cup (400-550 mg).  Tomato, raw, 1 medium (290 mg).  Tomatoes, canned,  cup (200-300 mg).  [place],  3 oz (250 mg).  Wheat germ, 1 oz (250 mg).  Winter squash,  cup (250 mg).  Yogurt, plain or fruited, 6 oz (260-435 mg).  Zucchini,  cup (220 mg).  Moderate in potassium The following foods and beverages have 50-200 mg of potassium per serving:  Apple, 1 each (150 mg).  Apple juice,  cup (150 mg).  Applesauce,  cup (90 mg).  Apricot nectar,  cup (140 mg).  Asparagus, small spears,  cup or 6 spears (155 mg).  Bagel, cinnamon raisin, 1 each (130 mg).  Bagel, egg or plain, 4 in., 1 each (70 mg).  Beans, green,  cup (90 mg).  Beans, yellow,  cup (190 mg).  Beer, regular, 12 oz (100 mg).  Beets, canned,  cup (125 mg).  Blackberries,  cup (115 mg).  Blueberries,  cup (60 mg).  Bread, whole wheat, 1 slice (70 mg).  Broccoli, raw,  cup (145 mg).  Cabbage,  cup (150 mg).  Carrots, cooked or raw,  cup (180 mg).  Cauliflower, raw,  cup (150 mg).  Celery, raw,  cup (155 mg).  Cereal, bran flakes, cup (120-150 mg).  Cheese, cottage,  cup (110 mg).  Cherries, 10 each (150 mg).  Chocolate, 1 oz bar (165 mg).  Coffee, brewed 6 oz (90 mg).  Corn,  cup or 1 ear (195 mg).  Cucumbers,  cup (80  mg).  Egg, large, 1 each (60 mg).  Eggplant,  cup (60 mg).  Endive, raw, cup (80 mg).  English muffin, 1 each (65 mg).  Fish, orange roughy, 3 oz (150 mg).  Frankfurter, beef or pork, 1 each (75 mg).  Fruit cocktail,  cup (115 mg).  Grape juice,  cup (170 mg).  Grapefruit,  fruit (175 mg).  Grapes,  cup (155 mg).  Greens: kale, turnip, collard,  cup (110-150 mg).  Ice cream or frozen yogurt, chocolate,  cup (175 mg).  Ice cream or frozen yogurt, vanilla,  cup (120-150 mg).  Lemons, limes, 1 each (80 mg).  Lettuce, all types, 1 cup (100 mg).  Mixed vegetables,  cup (150 mg).  Mushrooms, raw,  cup (110 mg).  Nuts: walnuts, pecans, or macadamia, 1 oz (125 mg).  Oatmeal,  cup (80 mg).  Okra,  cup (110 mg).  Onions, raw,  cup (120 mg).  Peach, 1 each (185 mg).  Peaches, canned,  cup (120 mg).  Pears, canned,  cup (120 mg).  Peas, green, frozen,  cup (90 mg).  Peppers, green,  cup (130 mg).  Peppers, red,  cup (160 mg).  Pineapple juice,  cup (165 mg).  Pineapple, fresh or canned,  cup (100 mg).  Plums, 1 each (105 mg).  Pudding, vanilla,  cup (150 mg).  Raspberries,  cup (90 mg).  Rhubarb,  cup (115 mg).  Rice, wild,  cup (80 mg).  Shrimp, 3 oz (155 mg).  Spinach, raw, 1 cup (170 mg).  Strawberries,  cup (125 mg).  Summer squash  cup (175-200 mg).  Swiss chard, raw, 1 cup (135 mg).  Tangerines, 1 each (140 mg).  Tea, brewed, 6 oz (65 mg).  Turnips,  cup (140 mg).  Watermelon,  cup (85 mg).  Wine, red, table, 5 oz (180 mg).  Wine, white, table, 5 oz (100 mg).  Low in potassium The following foods and beverages have less than 50 mg of potassium per serving.  Bread, white, 1 slice (30 mg).  Carbonated beverages, 12 oz (less than 5 mg).  Cheese, 1 oz (  20-30 mg).  Cranberries,  cup (45 mg).  Cranberry juice cocktail,  cup (20 mg).  Fats and oils, 1 Tbsp (less than 5 mg).  Hummus, 1 Tbsp (32  mg).  Nectar: papaya, mango, or pear,  cup (35 mg).  Rice, white or brown,  cup (50 mg).  Spaghetti or macaroni,  cup cooked (30 mg).  Tortilla, flour or corn, 1 each (50 mg).  Waffle, 4 in., 1 each (50 mg).  Water chestnuts,  cup (40 mg).  This information is not intended to replace advice given to you by your health care provider. Make sure you discuss any questions you have with your health care provider. Document Released: 11/03/2004 Document Revised: 08/28/2015 Document Reviewed: 02/16/2013 Elsevier Interactive Patient Education  [employer].

## 2016-11-27 NOTE — Progress Notes (Signed)
By signing my name below, I, [name], attest that this documentation has been prepared under the direction and in the presence of Norberto Sorenson, MD.  Electronically Signed: Arvilla Market, Medical Scribe. 11/27/16. 3:49 PM.  Subjective:    Patient ID: Amanda Haley, female    DOB: 1975/06/13, 41 y.o.   MRN: [id number]  HPI Chief Complaint  Patient presents with  . Joint Swelling    started 1 week ago     HPI Comments: Amanda Haley is a 41 y.o. female who presents to Primary Care at [facility] complaining of bilateral ankle swelling, R>L, onset last Friday (8 days ago). The worse day was yesterday, and she describes it as "2 eggs" on her ankles. After using the bathroom frequently her ankle swelling went down. Her feet look the same in the morning than they do in the afternoon. Pt has been avoiding salt and drinking a lot of water. She's been having an increased heart rate from moving. Pt had itchiness on her feet since June 2018 and isn't sure if it was a bug bite. Reports discomfort some itchiness when rubbing her hands over her feet. 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 Pt is on a progesterone only birthcontrol pill last week. Pt increased her amlodipine from 7.5 mg to 10 mg 2 months ago. Pt has been on various concentrations of amlodipine since March 2017.  Pt traveled to [place] for a cruise and she didn't have any pain. Pt is not around 2nd hand smoke. Pt denies PMHx of blood clots, or recent travel. Denies calf pain, palpitations, difficulty with bowel/urinary movements, SOB, coughing.  Pt has needle phobia.   Patient Active Problem List   Diagnosis Date Noted  . Class 1 obesity due to excess calories with serious comorbidity and body mass index (BMI) of 33.0 to 33.9 in adult 08/02/2016  . Seasonal allergic rhinitis due to pollen 12/27/2015  . Generalized anxiety disorder 01/31/2015  . Essential hypertension, benign 01/31/2015  . Atypical chest pain 06/29/2013  . Constipation  12/31/2011  . Eczema 12/31/2011   Past Medical History:  Diagnosis Date  . Allergy   . Anemia   . Back pain   . Chronic headaches   . Eczema   . Hypertension   . Vision changes    Past Surgical History:  Procedure Laterality Date  . CERVICAL POLYPECTOMY  2013  . DILATION AND CURETTAGE OF UTERUS  2013   No Known Allergies Prior to Admission medications   Medication Sig Start Date End Date Taking? Authorizing Provider  amLODipine (NORVASC) 10 MG tablet Take 1 tablet (10 mg total) by mouth daily. 09/02/16  Yes Ethelda Chick, MD  amLODipine (NORVASC) 2.5 MG tablet Take 3 tablets (7.5 mg total) by mouth daily. 10/04/16  Yes Ethelda Chick, MD  clobetasol ointment (TEMOVATE) 0.05 % APPLY  OINTMENT TOPICALLY TWICE DAILY 04/22/16  Yes Ethelda Chick, MD   Family History  Problem Relation Age of Onset  . Esophageal cancer Father        and throat cancer  . Breast cancer Mother 67       breast cancer  . Colon cancer Maternal Grandfather   . Dementia Maternal Grandmother   . Heart attack Cousin     Social History   Social History  . Marital status: Single    Spouse name: N/A  . Number of children: N/A  . Years of education: graduate   Occupational History  . Occupational hygienist  [employer]   Social History Main Topics  . Smoking status: Never Smoker  . Smokeless tobacco: Never Used  . Alcohol use 0.0 oz/week     Comment: socially  . Drug use: No  . Sexual activity: Not on file   Other Topics Concern  . Not on file   Social History Narrative   Marital status: single; not dating.      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      Lives: alone; family in [place] and [place].               Depression screen [facility] 2/9 10/04/2016 09/02/2016 07/07/2016 05/04/2016 02/17/2016  Decreased Interest 0 0 0 0 0  Down, Depressed, Hopeless 0 0 0 0 0  PHQ - 2 Score 0 0 0 0 0    Altered sleeping - - - - -  Tired, decreased energy - - - - -  Change in appetite - - - - -  Feeling bad or failure about yourself  - - - - -  Trouble concentrating - - - - -  Moving slowly or fidgety/restless - - - - -  Suicidal thoughts - - - - -  PHQ-9 Score - - - - -    Review of Systems  Respiratory: Negative for cough and shortness of breath.   Cardiovascular: Positive for leg swelling. Negative for palpitations.  Gastrointestinal: Negative for constipation and diarrhea.  Genitourinary: Negative for difficulty urinating, dysuria, frequency, hematuria and urgency.  Musculoskeletal: Positive for joint swelling. Negative for myalgias.   Objective:  Physical Exam  Constitutional: She appears well-developed and well-nourished. No distress.  HENT:  Head: Normocephalic and atraumatic.  Eyes: Conjunctivae are normal.  Neck: Neck supple.  Cardiovascular: Normal rate, regular rhythm and normal heart sounds.  Exam reveals no gallop and no friction rub.   No murmur heard. Pulses:      Dorsalis pedis pulses are 1+ on the right side, and 2+ on the left side.  Pulmonary/Chest: Effort normal and breath sounds normal. No respiratory distress. She has no wheezes. She has no rhonchi. She has no rales.  Musculoskeletal: She exhibits edema.  Trace edema lower extremity in the left foot Trace edema in the right leg, none in the left leg Left calf measures: 38.2 cm at 13 cm from base of foot, and 47 cm at 12 cm from ankle Right calf measures: 40 cm at 13 cm from base of foot, 48 cm at 12 cm from ankle  Neurological: She is alert.  Skin: Skin is warm and dry.  Psychiatric: She has a normal mood and affect. Her behavior is normal.  Nursing note and vitals reviewed.   Vitals:   11/27/16 1545  BP: 123/88  Pulse: 85  Resp: 16  Temp: 98 F (36.7 C)  TempSrc: Oral  SpO2: 100%  Weight: 240 lb 2 oz (108.9 kg)  Height: 5' 9.5" (1.765 m)   Body mass index is 34.95 kg/m. Assessment & Plan:    1. Bilateral edema of lower extremity   2. Edema of right lower extremity   3. Essential hypertension   4. Medication monitoring encounter      Meds ordered this encounter  Medications                 .  lisinopril-hydrochlorothiazide (PRINZIDE,ZESTORETIC) 20-25 MG tablet    Sig: Take 1 tablet by mouth daily.    Dispense:  90 tablet    Refill:  0    I personally performed the services described in this documentation, which was scribed in my presence. The recorded information has been reviewed and considered, and addended by me as needed.   Norberto Sorenson, M.D.  Primary Care at [facility] [address] [place], [place] [address] [phone] phone [phone] fax  11/29/16 10:25 PM

## 2016-11-30 ENCOUNTER — Encounter: Payer: Self-pay | Admitting: Family Medicine

## 2017-02-25 NOTE — Telephone Encounter (Signed)
Done

## 2017-05-08 IMAGING — DX DG CHEST 2V
2 series · 2 of 2 positions shown · non-contrast
Comparison: None in PACs

CLINICAL DATA: Hemoptysis for the past 2 weeks. Patient denies
cough. Nonsmoker.

EXAM:
CHEST  2 VIEW

[chest pa]
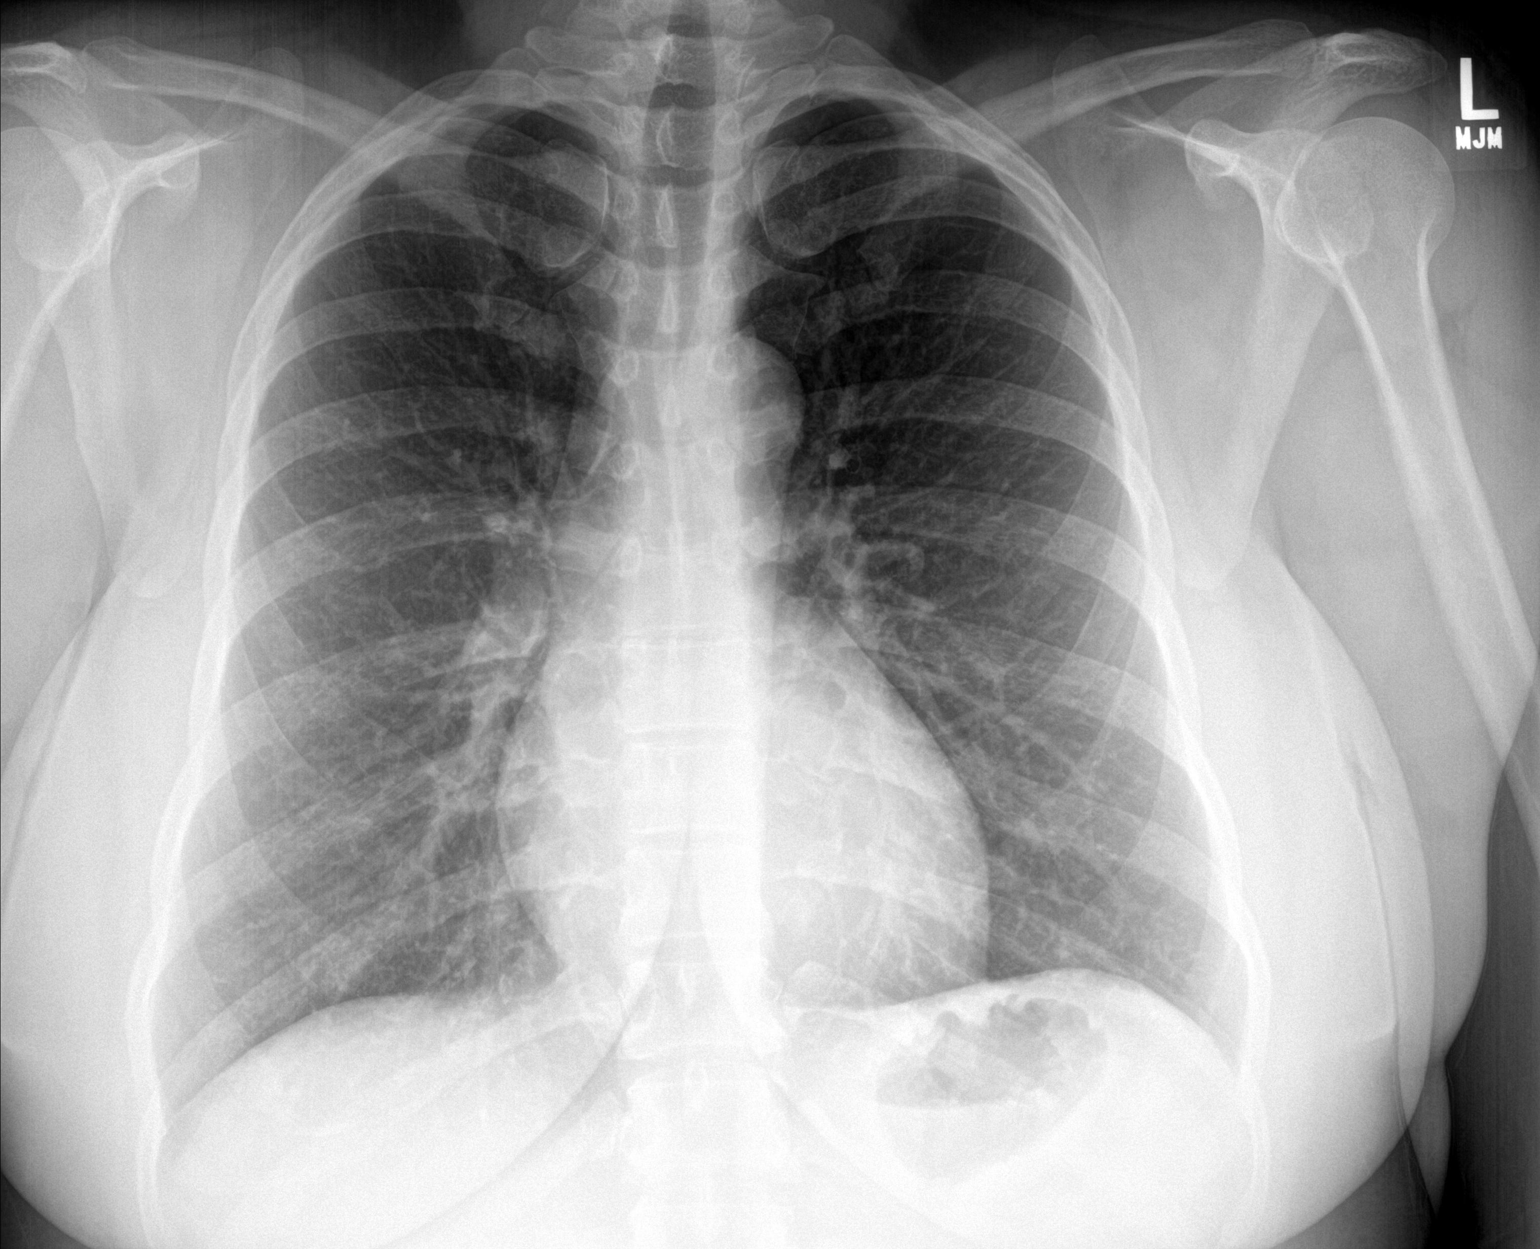

[chest lat]
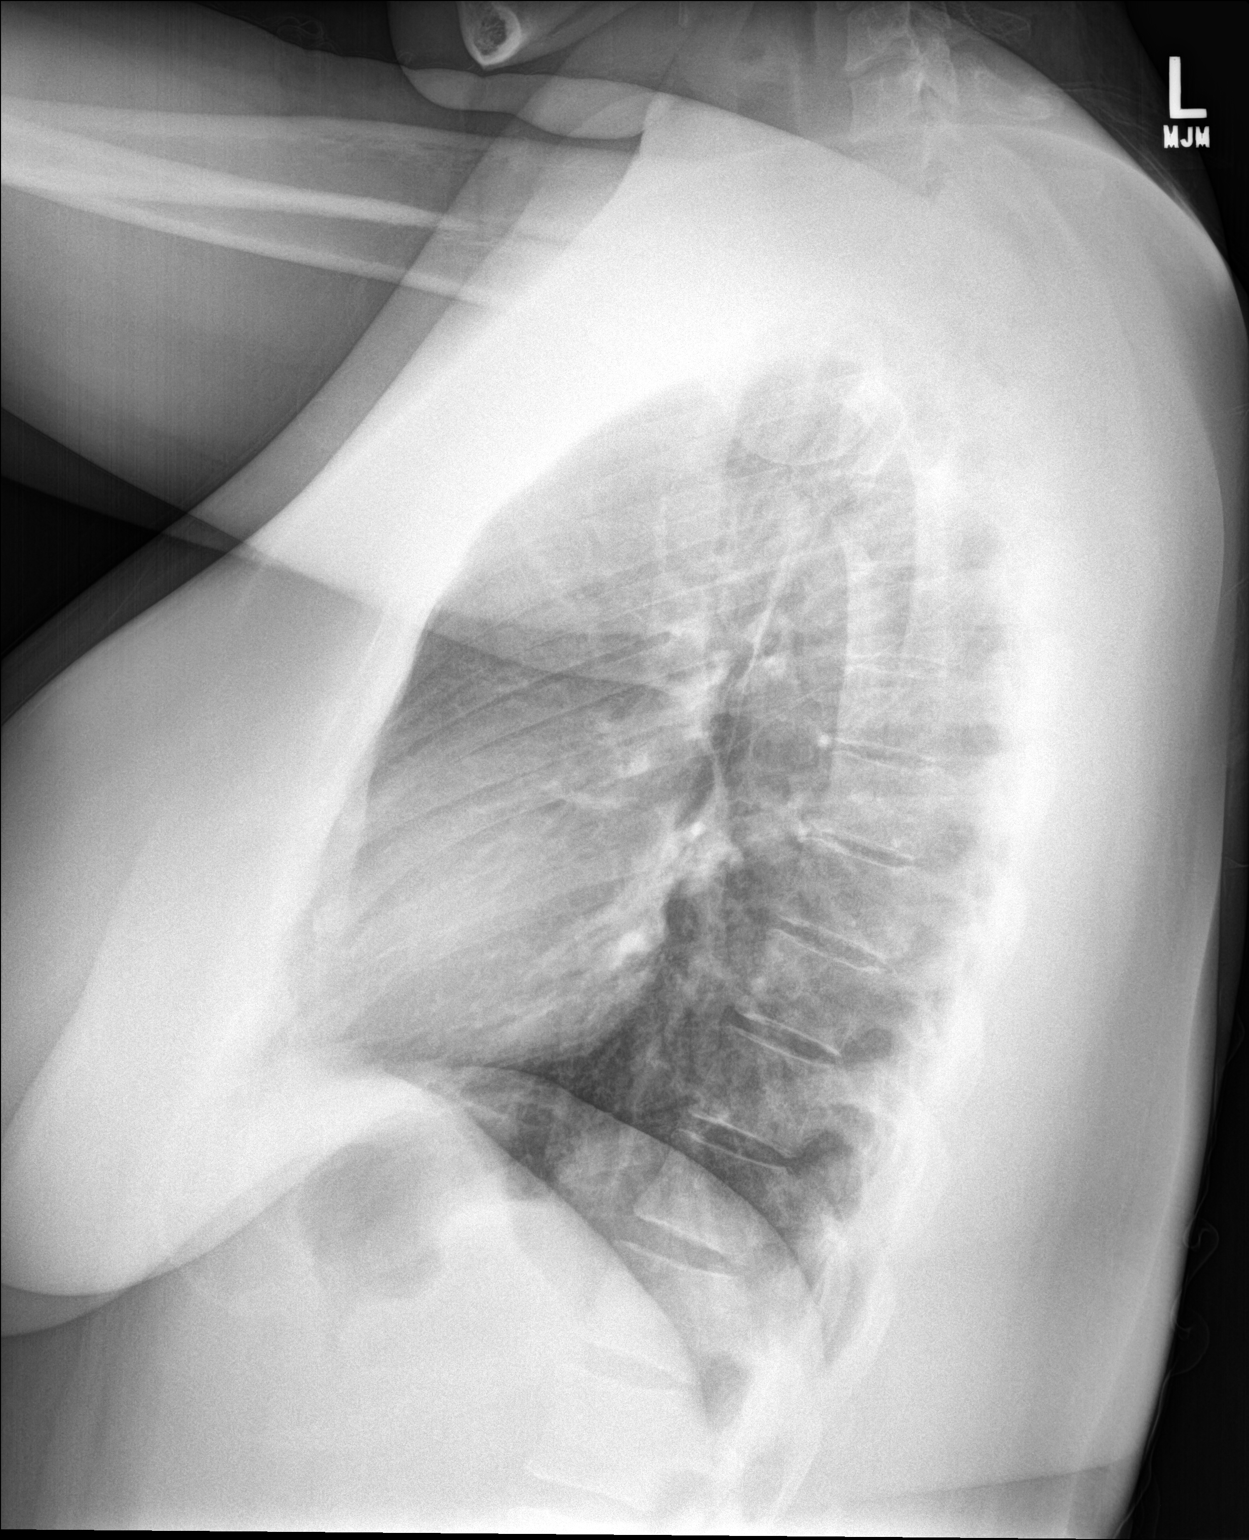

[2 of 2 positions shown; findings below may reference images not displayed]

FINDINGS: The lungs are borderline hyperinflated. There is no focal
infiltrate. There is no pleural effusion. The heart and pulmonary
vascularity are normal. The mediastinum is normal in width. The bony
thorax exhibits no acute abnormality.
IMPRESSION: Borderline hyperinflation may be voluntary or may reflect bronchitic
changes. There is no pneumonia, pulmonary parenchymal mass, or other
acute cardiopulmonary abnormality observed.

## 2017-08-15 ENCOUNTER — Encounter: Payer: Self-pay | Admitting: Family Medicine

## 2017-08-23 ENCOUNTER — Encounter: Payer: Self-pay | Admitting: Family Medicine

## 2017-08-23 ENCOUNTER — Other Ambulatory Visit: Payer: Self-pay | Admitting: Family Medicine

## 2017-08-23 DIAGNOSIS — L309 Dermatitis, unspecified: Secondary | ICD-10-CM

## 2017-08-25 ENCOUNTER — Encounter: Payer: Self-pay | Admitting: Family Medicine

## 2017-08-25 MED ORDER — CLOBETASOL PROPIONATE 0.05 % EX OINT: TOPICAL_OINTMENT | Freq: Two times a day (BID) | CUTANEOUS | 0 refills | Status: DC

## 2017-08-30 ENCOUNTER — Encounter: Payer: Self-pay | Admitting: Family Medicine

## 2017-08-30 ENCOUNTER — Ambulatory Visit: Payer: BLUE CROSS/BLUE SHIELD | Admitting: Family Medicine

## 2017-08-30 VITALS — BP 112/84 | HR 90 | Temp 98.7°F | Ht 69.0 in | Wt 237.6 lb

## 2017-08-30 DIAGNOSIS — Z130 Encounter for screening for diseases of the blood and blood-forming organs and certain disorders involving the immune mechanism: Secondary | ICD-10-CM | POA: Diagnosis not present

## 2017-08-30 DIAGNOSIS — I10 Essential (primary) hypertension: Secondary | ICD-10-CM

## 2017-08-30 DIAGNOSIS — Z13220 Encounter for screening for lipoid disorders: Secondary | ICD-10-CM

## 2017-08-30 DIAGNOSIS — F40243 Fear of flying: Secondary | ICD-10-CM

## 2017-08-30 DIAGNOSIS — L2082 Flexural eczema: Secondary | ICD-10-CM | POA: Diagnosis not present

## 2017-08-30 MED ORDER — LISINOPRIL-HYDROCHLOROTHIAZIDE 20-25 MG PO TABS: 1.0000 | ORAL_TABLET | Freq: Every day | ORAL | 3 refills | Status: DC

## 2017-08-30 MED ORDER — ALPRAZOLAM 0.5 MG PO TBDP: 0.5000 mg | ORAL_TABLET | Freq: Two times a day (BID) | ORAL | 0 refills | Status: DC | PRN

## 2017-08-30 MED ORDER — CLOBETASOL PROPIONATE 0.05 % EX OINT: TOPICAL_OINTMENT | Freq: Two times a day (BID) | CUTANEOUS | 0 refills | Status: DC

## 2017-08-30 MED ORDER — ALPRAZOLAM 0.5 MG PO TABS: 0.5000 mg | ORAL_TABLET | Freq: Two times a day (BID) | ORAL | 0 refills | Status: AC | PRN

## 2017-08-30 NOTE — Addendum Note (Signed)
Addended by: Shon Hale on: 08/30/2017 05:22 PM   Modules accepted: Orders

## 2017-08-30 NOTE — Progress Notes (Addendum)
5/28/20194:06 PM  Amanda Haley 1975/12/25, 42 y.o. female [id number]  Chief Complaint  Patient presents with  . Hypertension    bp management    HPI:   Patient is a 42 y.o. female with past medical history significant for HTN who presents today for followup  Patient reports tolerating current BP medication well, started in 2018, denies any side effects Checks her BP sporadically, at goal Requesting refill for clobetasol, uses prn for eczema which she has had since childhood Also requesting a small rx for bzd, has fear of flying and this summer has several upcoming long flights [place] CSR reviewed, appropriate  Reports upcoming CPE in July Very needlephobic, last labs in 2017   Fall Risk  08/30/2017 10/04/2016 09/02/2016 07/07/2016 05/04/2016  Falls in the past year? No No No No Yes  Number falls in past yr: - - - - 1  Injury with Fall? - - - - No     Depression screen [facility] 2/9 08/30/2017 10/04/2016 09/02/2016  Decreased Interest 0 0 0  Down, Depressed, Hopeless 0 0 0  PHQ - 2 Score 0 0 0  Altered sleeping - - -  Tired, decreased energy - - -  Change in appetite - - -  Feeling bad or failure about yourself  - - -  Trouble concentrating - - -  Moving slowly or fidgety/restless - - -  Suicidal thoughts - - -  PHQ-9 Score - - -    No Known Allergies  Prior to Admission medications   Medication Sig Start Date End Date Taking? Authorizing Provider  clobetasol ointment (TEMOVATE) 0.05 % Apply topically 2 (two) times daily. 08/25/17  Yes Ethelda Chick, MD  lisinopril-hydrochlorothiazide (PRINZIDE,ZESTORETIC) 20-25 MG tablet Take 1 tablet by mouth daily. 11/27/16  Yes Sherren Mocha, MD    Past Medical History:  Diagnosis Date  . Allergy   . Anemia   . Back pain   . Chronic headaches   . Eczema   . Hypertension   . Vision changes     Past Surgical History:  Procedure Laterality Date  . CERVICAL POLYPECTOMY  2013  . DILATION AND CURETTAGE OF UTERUS  2013    Social History    Tobacco Use  . Smoking status: Never Smoker  . Smokeless tobacco: Never Used  Substance Use Topics  . Alcohol use: Yes    Alcohol/week: 0.0 oz    Comment: socially    Family History  Problem Relation Age of Onset  . Esophageal cancer Father        and throat cancer  . Breast cancer Mother 83       breast cancer  . Colon cancer Maternal Grandfather   . Dementia Maternal Grandmother   . Heart attack Cousin     Review of Systems  Constitutional: Negative for chills and fever.  Respiratory: Negative for cough and shortness of breath.   Cardiovascular: Negative for chest pain, palpitations and leg swelling.  Gastrointestinal: Negative for abdominal pain, nausea and vomiting.     OBJECTIVE:  Blood pressure 112/84, pulse 90, temperature 98.7 F (37.1 C), temperature source Oral, height  (1.753 m), weight 237 lb 9.6 oz (107.8 kg), last menstrual period 08/30/2017, SpO2 100 %.  Physical Exam  Constitutional: She is oriented to person, place, and time. She appears well-developed and well-nourished.  HENT:  Head: Normocephalic and atraumatic.  Mouth/Throat: Oropharynx is clear and moist. No oropharyngeal exudate.  Eyes: Pupils are equal, round, and reactive to  light. EOM are normal. No scleral icterus.  Neck: Neck supple.  Cardiovascular: Normal rate, regular rhythm and normal heart sounds. Exam reveals no gallop and no friction rub.  No murmur heard. Pulmonary/Chest: Effort normal and breath sounds normal. She has no wheezes. She has no rales.  Musculoskeletal: She exhibits no edema.  Neurological: She is alert and oriented to person, place, and time.  Skin: Skin is warm and dry.  Psychiatric: Her mood appears anxious.  Nursing note and vitals reviewed.   ASSESSMENT and PLAN  1. Essential hypertension, benign Controlled, cont current regime. As she is very needlephobic and has no monitoring of her kidney function since 2017 we will do HCM labs as well.  -  Comprehensive metabolic panel - TSH  2. Screening for deficiency anemia - CBC with Differential/Platelet  3. Screening for lipid disorders - Lipid panel  4. Flexural eczema  5. Fear of flying [place] CSR reviwed. Small rx for  6. Eczema - clobetasol ointment (TEMOVATE) 0.05 %; Apply topically 2 (two) times daily.  Other orders - lisinopril-hydrochlorothiazide (PRINZIDE,ZESTORETIC) 20-25 MG tablet; Take 1 tablet by mouth daily. - ALPRAZolam 0.5 MG tablet; Take 1-2 tablets (0.5-1 mg total) by mouth 2 (two) times daily as needed for anxiety.  Return for as scheduled for CPE.  Patient unable to have blood drawn, was too nervous. Will defer to upcoming CPE.  Myles Lipps, MD Primary Care at [facility] [address] [place], [place] [address] Ph.  [phone] Fax [phone]

## 2017-08-30 NOTE — Patient Instructions (Signed)
     IF you received an x-ray today, you will receive an invoice from [facility]. Please contact [facility] at [phone] with questions or concerns regarding your invoice.   IF you received labwork today, you will receive an invoice from [facility]. Please contact [facility] at [phone] with questions or concerns regarding your invoice.   Our billing staff will not be able to assist you with questions regarding bills from these companies.  You will be contacted with the lab results as soon as they are available. The fastest way to get your results is to activate your My Chart account. Instructions are located on the last page of this paperwork. If you have not heard from us regarding the results in 2 weeks, please contact this office.     

## 2017-10-26 ENCOUNTER — Encounter: Payer: Self-pay | Admitting: Family Medicine

## 2017-10-26 ENCOUNTER — Other Ambulatory Visit: Payer: Self-pay

## 2017-10-26 ENCOUNTER — Ambulatory Visit (INDEPENDENT_AMBULATORY_CARE_PROVIDER_SITE_OTHER): Payer: BLUE CROSS/BLUE SHIELD | Admitting: Family Medicine

## 2017-10-26 VITALS — BP 108/78 | HR 101 | Temp 98.0°F | Resp 16 | Ht 69.29 in | Wt 238.0 lb

## 2017-10-26 DIAGNOSIS — K5901 Slow transit constipation: Secondary | ICD-10-CM

## 2017-10-26 DIAGNOSIS — F411 Generalized anxiety disorder: Secondary | ICD-10-CM

## 2017-10-26 DIAGNOSIS — L2082 Flexural eczema: Secondary | ICD-10-CM | POA: Diagnosis not present

## 2017-10-26 DIAGNOSIS — E6609 Other obesity due to excess calories: Secondary | ICD-10-CM

## 2017-10-26 DIAGNOSIS — S239XXD Sprain of unspecified parts of thorax, subsequent encounter: Secondary | ICD-10-CM

## 2017-10-26 DIAGNOSIS — F5104 Psychophysiologic insomnia: Secondary | ICD-10-CM

## 2017-10-26 DIAGNOSIS — R002 Palpitations: Secondary | ICD-10-CM | POA: Diagnosis not present

## 2017-10-26 DIAGNOSIS — Z0001 Encounter for general adult medical examination with abnormal findings: Secondary | ICD-10-CM | POA: Diagnosis not present

## 2017-10-26 DIAGNOSIS — Z131 Encounter for screening for diabetes mellitus: Secondary | ICD-10-CM

## 2017-10-26 DIAGNOSIS — J301 Allergic rhinitis due to pollen: Secondary | ICD-10-CM

## 2017-10-26 DIAGNOSIS — Z0000 Encounter for general adult medical examination without abnormal findings: Secondary | ICD-10-CM

## 2017-10-26 DIAGNOSIS — I10 Essential (primary) hypertension: Secondary | ICD-10-CM

## 2017-10-26 DIAGNOSIS — Z6834 Body mass index (BMI) 34.0-34.9, adult: Secondary | ICD-10-CM

## 2017-10-26 LAB — POCT URINALYSIS DIP (MANUAL ENTRY)
BILIRUBIN UA: NEGATIVE mg/dL
Bilirubin, UA: NEGATIVE
Glucose, UA: NEGATIVE mg/dL
LEUKOCYTES UA: NEGATIVE
Nitrite, UA: NEGATIVE
Protein Ur, POC: NEGATIVE mg/dL
Spec Grav, UA: 1.015 (ref 1.010–1.025)
UROBILINOGEN UA: 0.2 U/dL
pH, UA: 6.5 (ref 5.0–8.0)

## 2017-10-26 MED ORDER — CLOBETASOL PROPIONATE 0.05 % EX OINT: TOPICAL_OINTMENT | Freq: Two times a day (BID) | CUTANEOUS | 1 refills | Status: AC

## 2017-10-26 MED ORDER — CYCLOBENZAPRINE HCL 5 MG PO TABS: 5.0000 mg | ORAL_TABLET | Freq: Three times a day (TID) | ORAL | 1 refills | Status: AC | PRN

## 2017-10-26 MED ORDER — LISINOPRIL-HYDROCHLOROTHIAZIDE 20-25 MG PO TABS: 1.0000 | ORAL_TABLET | Freq: Every day | ORAL | 3 refills | Status: AC

## 2017-10-26 MED ORDER — KETOROLAC TROMETHAMINE 10 MG PO TABS: 10.0000 mg | ORAL_TABLET | Freq: Three times a day (TID) | ORAL | 2 refills | Status: AC | PRN

## 2017-10-26 MED ORDER — FLUOXETINE HCL 20 MG PO TABS: 20.0000 mg | ORAL_TABLET | Freq: Every day | ORAL | 1 refills | Status: AC

## 2017-10-26 MED ORDER — FLUTICASONE PROPIONATE 50 MCG/ACT NA SUSP: 2.0000 | Freq: Every day | NASAL | 3 refills | Status: AC

## 2017-10-26 NOTE — Progress Notes (Signed)
Subjective:    Patient ID: Amanda Haley, female    DOB: 04-13-1975, 42 y.o.   MRN: [id number]  10/26/2017  Annual Exam    HPI This 42 y.o. female presents for COMPLETE PHYSICAL EXAMINATION.  Last physical:  none Pap smear:  2018 Mammogram:  2019 [facility] Eye exam:  2019 May Dental exam:  due   Visual Acuity Screening   Right eye Left eye Both eyes  Without correction:     With correction: 20/20 20/20 20/20    BP Readings from Last 3 Encounters:  10/26/17 108/78  08/30/17 112/84  11/27/16 123/88   Wt Readings from Last 3 Encounters:  10/26/17 238 lb (108 kg)  08/30/17 237 lb 9.6 oz (107.8 kg)  11/27/16 240 lb 2 oz (108.9 kg)    There is no immunization history on file for this patient.  Health Maintenance  Topic Date Due  . HIV Screening  07/13/1991  . TETANUS/TDAP  07/13/1995  . PAP SMEAR  09/04/2014  . INFLUENZA VACCINE  11/03/2017    HTN; Patient reports good compliance with medication, good tolerance to medication, and good symptom control.  Urinary frequency has decreased since last visit with [place].  Not checking BP.  Low back pain: getting straining feeling under arm and upper arm and into neck and upper back; stiff and tight; went to ED in 01/2017.  Emergency with PT in [place] was helpful.  Has not been able to reeive the same benefit with current PT.  Turned and caught up; spasms; back massager not helpful.   Presented to ED in 01/2017; ruled out pulmonary embolism. Prescribed flexeril and toradol.    Flight anxiety: Xanax worked well for flight.  Prayer.  Took muscle relaxer.     Review of Systems  Constitutional: Negative for activity change, appetite change, chills, diaphoresis, fatigue, fever and unexpected weight change.  HENT: Positive for congestion. Negative for dental problem, drooling, ear discharge, ear pain, facial swelling, hearing loss, mouth sores, nosebleeds, postnasal drip, rhinorrhea, sinus pressure, sinus pain, sneezing, sore throat,  tinnitus, trouble swallowing and voice change.   Eyes: Negative for photophobia, pain, discharge, redness, itching and visual disturbance.  Respiratory: Negative for apnea, cough, choking, chest tightness, shortness of breath, wheezing and stridor.   Cardiovascular: Negative for chest pain, palpitations and leg swelling.  Gastrointestinal: Positive for constipation. Negative for abdominal distention, abdominal pain, anal bleeding, blood in stool, diarrhea, nausea, rectal pain and vomiting.  Endocrine: Negative for cold intolerance, heat intolerance, polydipsia, polyphagia and polyuria.  Genitourinary: Negative for decreased urine volume, difficulty urinating, dyspareunia, dysuria, enuresis, flank pain, frequency, genital sores, hematuria, menstrual problem, pelvic pain, urgency, vaginal bleeding, vaginal discharge and vaginal pain.       Nocturia x 0-3.    Musculoskeletal: Positive for back pain and myalgias. Negative for arthralgias, gait problem, joint swelling, neck pain and neck stiffness.  Skin: Negative for color change, pallor, rash and wound.  Allergic/Immunologic: Negative for environmental allergies, food allergies and immunocompromised state.  Neurological: Negative for dizziness, tremors, seizures, syncope, facial asymmetry, speech difficulty, weakness, light-headedness, numbness and headaches.  Hematological: Negative for adenopathy. Does not bruise/bleed easily.  Psychiatric/Behavioral: Positive for sleep disturbance. Negative for agitation, behavioral problems, confusion, decreased concentration, dysphoric mood, hallucinations, self-injury and suicidal ideas. The patient is nervous/anxious. The patient is not hyperactive.        Bedtime 11; wakes up 7.    Past Medical History:  Diagnosis Date  . Allergy   . Anemia   . Anxiety   .  Back pain   . Chronic headaches   . Eczema   . Hypertension   . Vision changes    Past Surgical History:  Procedure Laterality Date  . CERVICAL  POLYPECTOMY  2013  . DILATION AND CURETTAGE OF UTERUS  2013   No Known Allergies Current Outpatient Medications on File Prior to Visit  Medication Sig Dispense Refill  . ALPRAZolam (XANAX) 0.5 MG tablet Take 1-2 tablets (0.5-1 mg total) by mouth 2 (two) times daily as needed for anxiety. 10 tablet 0   No current facility-administered medications on file prior to visit.    Social History   Socioeconomic History  . Marital status: Single    Spouse name: Not on file  . Number of children: Not on file  . Years of education: graduate  . Highest education level: Not on file  Occupational History  . Occupation: Wellsite geologist: [employer]    Comment: Film/video editor  Social Needs  . Financial resource strain: Not on file  . Food insecurity:    Worry: Not on file    Inability: Not on file  . Transportation needs:    Medical: Not on file    Non-medical: Not on file  Tobacco Use  . Smoking status: Never Smoker  . Smokeless tobacco: Never Used  Substance and Sexual Activity  . Alcohol use: Yes    Alcohol/week: 0.0 oz    Comment: socially  . Drug use: No  . Sexual activity: Not on file  Lifestyle  . Physical activity:    Days per week: Not on file    Minutes per session: Not on file  . Stress: Not on file  Relationships  . Social connections:    Talks on phone: Not on file    Gets together: Not on file    Attends religious service: Not on file    Active member of club or organization: Not on file    Attends meetings of clubs or organizations: Not on file    Relationship status: Not on file  . Intimate partner violence:    Fear of current or ex partner: Not on file    Emotionally abused: Not on file    Physically abused: Not on file    Forced sexual activity: Not on file  Other Topics Concern  . Not on file  Social History Narrative   Marital status: single; dating in 2019.      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      Lives: alone on campus in [place]; family in [place] and [place].      Tobacco: none      Alcohol: one drink per week      Drugs: none      Exercise: none in 2019.      Family History  Problem Relation Age of Onset  . Esophageal cancer Father        and throat cancer  . Breast cancer Mother 21       breast cancer  . Colon cancer Maternal Grandfather   . Dementia Maternal Grandmother   . Heart attack Cousin        Objective:    BP 108/78   Pulse (!) 101   Temp 98 F (36.7 C) (Oral)   Resp 16   Ht 5' 9.29" (1.76 m)   Wt  238 lb (108 kg)   SpO2 100%   BMI 34.85 kg/m  Physical Exam  Constitutional: She is oriented to person, place, and time. She appears well-developed and well-nourished. No distress.  HENT:  Head: Normocephalic and atraumatic.  Right Ear: External ear normal.  Left Ear: External ear normal.  Nose: Nose normal.  Mouth/Throat: Oropharynx is clear and moist.  Eyes: Pupils are equal, round, and reactive to light. Conjunctivae and EOM are normal.  Neck: Normal range of motion and full passive range of motion without pain. Neck supple. No JVD present. Carotid bruit is not present. No thyromegaly present.  Cardiovascular: Normal rate, regular rhythm and normal heart sounds. Exam reveals no gallop and no friction rub.  No murmur heard. Pulmonary/Chest: Effort normal and breath sounds normal. She has no wheezes. She has no rales.  Abdominal: Soft. Bowel sounds are normal. She exhibits no distension and no mass. There is no tenderness. There is no rebound and no guarding.  Musculoskeletal:       Right shoulder: Normal.       Left shoulder: Normal.       Cervical back: Normal.       Thoracic back: She exhibits tenderness, pain and spasm. She exhibits normal range of motion.  Lymphadenopathy:    She has no cervical adenopathy.  Neurological: She is alert and oriented to person, place, and time. She has normal  reflexes. No cranial nerve deficit. She exhibits normal muscle tone. Coordination normal.  Skin: Skin is warm and dry. No rash noted. She is not diaphoretic. No erythema. No pallor.  Psychiatric: She has a normal mood and affect. Her behavior is normal. Judgment and thought content normal.  Nursing note and vitals reviewed.  No results found. Depression screen [facility] 2/9 10/26/2017 08/30/2017 10/04/2016 09/02/2016 07/07/2016  Decreased Interest 0 0 0 0 0  Down, Depressed, Hopeless 0 0 0 0 0  PHQ - 2 Score 0 0 0 0 0  Altered sleeping - - - - -  Tired, decreased energy - - - - -  Change in appetite - - - - -  Feeling bad or failure about yourself  - - - - -  Trouble concentrating - - - - -  Moving slowly or fidgety/restless - - - - -  Suicidal thoughts - - - - -  PHQ-9 Score - - - - -   Fall Risk  10/26/2017 08/30/2017 10/04/2016 09/02/2016 07/07/2016  Falls in the past year? No No No No No  Number falls in past yr: - - - - -  Injury with Fall? - - - - -        Assessment & Plan:   1. Routine physical examination   2. Generalized anxiety disorder   3. Essential hypertension, benign   4. Flexural eczema   5. Slow transit constipation   6. Screening for diabetes mellitus   7. Palpitations   8. Seasonal allergic rhinitis due to pollen   9. Psychophysiological insomnia   10. Thoracic back sprain, subsequent encounter   11. Class 1 obesity due to excess calories with serious comorbidity and body mass index (BMI) of 34.0 to 34.9 in adult     -anticipatory guidance provided --- exercise, weight loss, safe driving practices, safe sexual practices -obtain age appropriate screening labs and labs for chronic disease management. -Palpitations: refer for echo in [place], [place]. -Anxiety disorder with insomnia: uncontrolled; rx for Prozac 20mg  1/2 daily for one month and then one daily; recommend  counseling and exercise.  -HTN: well controlled; obtain labs; continue current medications. -Allergic  rhinitis: uncontrolled; restart Flonase. -Eczema: chronic; refill provided. -Thoracic/trapezius sprain L: chronic with intermittent flares; refill of Toradol and Flexeril provided; continue physical therapy. -Recommend weight loss, exercise for 30-60 minutes five days per week; recommend 1200 kcal restriction per day with a minimum of 60 grams of protein per day.  Eat 3 meals per day. Do not skip meals. Consider having a protein shake as a meal replacement to aid with eliminating meal skipping. Look for products with <220 calories, <7 gm sugar, and 20-30 gm protein.  Eat breakfast within 2 hours of getting up.   Make  your plate non-starchy vegetables,  protein, and  carbohydrates at lunch and dinner.   Aim for at least 64 oz. of calorie-free beverages daily (water, Crystal Light, diet green tea, etc.). Eliminate any sugary beverages such as regular soda, sweet tea, or fruit juice.   Pay attention to hunger and fullness cues.  Stop eating once you feel satisfied; don't wait until you feel full, stuffed, or sick from eating.  Choose lean meats and low fat/fat free dairy products.  Choose foods high in fiber such as fruits, vegetables, and whole grains (brown rice, whole wheat pasta, whole wheat bread, etc.).  Limit foods with added sugar to <7 gm per serving.  Always eat in the kitchen/dining room.  Never eat in the bedroom or in front of the TV.    Orders Placed This Encounter  Procedures  . CBC with Differential/Platelet  . Comprehensive metabolic panel    Order Specific Question:   Has the patient fasted?    Answer:   No  . Hemoglobin A1c  . Lipid panel    Order Specific Question:   Has the patient fasted?    Answer:   No  . TSH  . Vitamin B12  . VITAMIN D 25 Hydroxy (Vit-D Deficiency, Fractures)  . POCT urinalysis dipstick  . EKG 12-Lead  . ECHOCARDIOGRAM COMPLETE    [place], [place]    Standing Status:   Future    Standing Expiration Date:   01/28/2019    Order  Specific Question:   Where should this test be performed    Answer:   External    Order Specific Question:   Perflutren DEFINITY (image enhancing agent) should be administered unless hypersensitivity or allergy exist    Answer:   Administer Perflutren    Order Specific Question:   Expected Date:    Answer:   1 month   Meds ordered this encounter  Medications  . lisinopril-hydrochlorothiazide (PRINZIDE,ZESTORETIC) 20-25 MG tablet    Sig: Take 1 tablet by mouth daily.    Dispense:  90 tablet    Refill:  3  . cyclobenzaprine (FLEXERIL) 5 MG tablet    Sig: Take 1 tablet (5 mg total) by mouth 3 (three) times daily as needed for muscle spasms.    Dispense:  60 tablet    Refill:  1  . clobetasol ointment (TEMOVATE) 0.05 %    Sig: Apply topically 2 (two) times daily.    Dispense:  60 g    Refill:  1    Please consider 90 day supplies to promote better adherence  . FLUoxetine (PROZAC) 20 MG tablet    Sig: Take 1 tablet (20 mg total) by mouth daily.    Dispense:  90 tablet    Refill:  1  . ketorolac (TORADOL) 10 MG tablet    Sig:  Take 1 tablet (10 mg total) by mouth every 8 (eight) hours as needed.    Dispense:  20 tablet    Refill:  2  . fluticasone (FLONASE) 50 MCG/ACT nasal spray    Sig: Place 2 sprays into both nostrils daily.    Dispense:  48 g    Refill:  3    Return in about 3 months (around 01/26/2018) for recheck.   Korynne Dols Paulita FujitaMartin Elice Crigger, M.D. Primary Care at [facility]  [place] previously [facility] [address] [place], [place]  [id number] [phone] phone [phone] fax

## 2017-10-26 NOTE — Patient Instructions (Addendum)
IF you received an x-ray today, you will receive an invoice from [facility]. Please contact [facility] at [phone] with questions or concerns regarding your invoice.   IF you received labwork today, you will receive an invoice from [facility]. Please contact [facility] at [phone] with questions or concerns regarding your invoice.   Our billing staff will not be able to assist you with questions regarding bills from these companies.  You will be contacted with the lab results as soon as they are available. The fastest way to get your results is to activate your My Chart account. Instructions are located on the last page of this paperwork. If you have not heard from [place] regarding the results in 2 weeks, please contact this office.      Preventive Care 40-64 Years, Female Preventive care refers to lifestyle choices and visits with your health care provider that can promote health and wellness. What does preventive care include?  A yearly physical exam. This is also called an annual well check.  Dental exams once or twice a year.  Routine eye exams. Ask your health care provider how often you should have your eyes checked.  Personal lifestyle choices, including: ? Daily care of your teeth and gums. ? Regular physical activity. ? Eating a healthy diet. ? Avoiding tobacco and drug use. ? Limiting alcohol use. ? Practicing safe sex. ? Taking low-dose aspirin daily starting at age 34. ? Taking vitamin and mineral supplements as recommended by your health care provider. What happens during an annual well check? The services and screenings done by your health care provider during your annual well check will depend on your age, overall health, lifestyle risk factors, and family history of disease. Counseling Your health care provider may ask you questions about your:  Alcohol use.  Tobacco use.  Drug use.  Emotional well-being.  Home and relationship  well-being.  Sexual activity.  Eating habits.  Work and work Statistician.  Method of birth control.  Menstrual cycle.  Pregnancy history.  Screening You may have the following tests or measurements:  Height, weight, and BMI.  Blood pressure.  Lipid and cholesterol levels. These may be checked every 5 years, or more frequently if you are over 65 years old.  Skin check.  Lung cancer screening. You may have this screening every year starting at age 102 if you have a 30-pack-year history of smoking and currently smoke or have quit within the past 15 years.  Fecal occult blood test (FOBT) of the stool. You may have this test every year starting at age 33.  Flexible sigmoidoscopy or colonoscopy. You may have a sigmoidoscopy every 5 years or a colonoscopy every 10 years starting at age 23.  Hepatitis C blood test.  Hepatitis B blood test.  Sexually transmitted disease (STD) testing.  Diabetes screening. This is done by checking your blood sugar (glucose) after you have not eaten for a while (fasting). You may have this done every 1-3 years.  Mammogram. This may be done every 1-2 years. Talk to your health care provider about when you should start having regular mammograms. This may depend on whether you have a family history of breast cancer.  BRCA-related cancer screening. This may be done if you have a family history of breast, ovarian, tubal, or peritoneal cancers.  Pelvic exam and Pap test. This may be done every 3 years starting at age 3. Starting at age 55, this may be done every 5 years if  you have a Pap test in combination with an HPV test.  Bone density scan. This is done to screen for osteoporosis. You may have this scan if you are at high risk for osteoporosis.  Discuss your test results, treatment options, and if necessary, the need for more tests with your health care provider. Vaccines Your health care provider may recommend certain vaccines, such  as:  Influenza vaccine. This is recommended every year.  Tetanus, diphtheria, and acellular pertussis (Tdap, Td) vaccine. You may need a Td booster every 10 years.  Varicella vaccine. You may need this if you have not been vaccinated.  Zoster vaccine. You may need this after age 77.  Measles, mumps, and rubella (MMR) vaccine. You may need at least one dose of MMR if you were born in 1957 or later. You may also need a second dose.  Pneumococcal 13-valent conjugate (PCV13) vaccine. You may need this if you have certain conditions and were not previously vaccinated.  Pneumococcal polysaccharide (PPSV23) vaccine. You may need one or two doses if you smoke cigarettes or if you have certain conditions.  Meningococcal vaccine. You may need this if you have certain conditions.  Hepatitis A vaccine. You may need this if you have certain conditions or if you travel or work in places where you may be exposed to hepatitis A.  Hepatitis B vaccine. You may need this if you have certain conditions or if you travel or work in places where you may be exposed to hepatitis B.  Haemophilus influenzae type b (Hib) vaccine. You may need this if you have certain conditions.  Talk to your health care provider about which screenings and vaccines you need and how often you need them. This information is not intended to replace advice given to you by your health care provider. Make sure you discuss any questions you have with your health care provider. Document Released: 04/18/2015 Document Revised: 12/10/2015 Document Reviewed: 01/21/2015 Elsevier Interactive Patient Education  [employer].

## 2017-10-27 ENCOUNTER — Encounter: Payer: Self-pay | Admitting: Family Medicine

## 2017-10-27 DIAGNOSIS — F5104 Psychophysiologic insomnia: Secondary | ICD-10-CM | POA: Insufficient documentation

## 2017-10-27 LAB — CBC WITH DIFFERENTIAL/PLATELET
BASOS ABS: 0 10*3/uL (ref 0.0–0.2)
Basos: 0 %
EOS (ABSOLUTE): 0.3 10*3/uL (ref 0.0–0.4)
Eos: 3 %
HEMOGLOBIN: 12.2 g/dL (ref 11.1–15.9)
Hematocrit: 37.8 % (ref 34.0–46.6)
IMMATURE GRANS (ABS): 0 10*3/uL (ref 0.0–0.1)
Immature Granulocytes: 0 %
LYMPHS ABS: 2.4 10*3/uL (ref 0.7–3.1)
LYMPHS: 23 %
MCH: 28.6 pg (ref 26.6–33.0)
MCHC: 32.3 g/dL (ref 31.5–35.7)
MCV: 89 fL (ref 79–97)
MONOCYTES: 7 %
Monocytes Absolute: 0.7 10*3/uL (ref 0.1–0.9)
Neutrophils Absolute: 6.8 10*3/uL (ref 1.4–7.0)
Neutrophils: 67 %
PLATELETS: 271 10*3/uL (ref 150–450)
RBC: 4.27 x10E6/uL (ref 3.77–5.28)
RDW: 12.5 % (ref 12.3–15.4)
WBC: 10.2 10*3/uL (ref 3.4–10.8)

## 2017-10-27 LAB — COMPREHENSIVE METABOLIC PANEL
ALK PHOS: 65 IU/L (ref 39–117)
ALT: 10 IU/L (ref 0–32)
AST: 12 IU/L (ref 0–40)
Albumin/Globulin Ratio: 1.6 (ref 1.2–2.2)
Albumin: 4.8 g/dL (ref 3.5–5.5)
BUN/Creatinine Ratio: 11 (ref 9–23)
BUN: 10 mg/dL (ref 6–24)
Bilirubin Total: 1.1 mg/dL (ref 0.0–1.2)
CALCIUM: 9.8 mg/dL (ref 8.7–10.2)
CO2: 27 mmol/L (ref 20–29)
CREATININE: 0.94 mg/dL (ref 0.57–1.00)
Chloride: 95 mmol/L — ABNORMAL LOW (ref 96–106)
GFR calc Af Amer: 87 mL/min/{1.73_m2} (ref >59)
GFR, EST NON AFRICAN AMERICAN: 76 mL/min/{1.73_m2} (ref >59)
GLUCOSE: 81 mg/dL (ref 65–99)
Globulin, Total: 3 g/dL (ref 1.5–4.5)
POTASSIUM: 3.8 mmol/L (ref 3.5–5.2)
Sodium: 137 mmol/L (ref 134–144)
Total Protein: 7.8 g/dL (ref 6.0–8.5)

## 2017-10-27 LAB — HEMOGLOBIN A1C
ESTIMATED AVERAGE GLUCOSE: 85 mg/dL
HEMOGLOBIN A1C: 4.6 % — AB (ref 4.8–5.6)

## 2017-10-27 LAB — LIPID PANEL
CHOLESTEROL TOTAL: 194 mg/dL (ref 100–199)
Chol/HDL Ratio: 4 ratio (ref 0.0–4.4)
HDL: 48 mg/dL (ref >39)
LDL Calculated: 130 mg/dL — ABNORMAL HIGH (ref 0–99)
Triglycerides: 78 mg/dL (ref 0–149)
VLDL Cholesterol Cal: 16 mg/dL (ref 5–40)

## 2017-10-27 LAB — VITAMIN B12: VITAMIN B 12: 369 pg/mL (ref 232–1245)

## 2017-10-27 LAB — TSH: TSH: 1.25 u[IU]/mL (ref 0.450–4.500)

## 2017-10-27 LAB — VITAMIN D 25 HYDROXY (VIT D DEFICIENCY, FRACTURES): VIT D 25 HYDROXY: 31.8 ng/mL (ref 30.0–100.0)

## 2017-11-08 ENCOUNTER — Telehealth: Payer: Self-pay | Admitting: Family Medicine

## 2017-11-08 NOTE — Telephone Encounter (Signed)
Pt notified she will need to come in to follow up so the boil can be reevaluated, she verbalized understanding

## 2017-11-08 NOTE — Telephone Encounter (Signed)
Copied from CRM [phone]. Topic: Quick Communication - See Telephone Encounter >> Nov 08, 2017  1:32 PM Burchel, Abbi R wrote: CRM for notification. See Telephone encounter for: 11/08/17.  Pt ststes she has a boil under her rt arm and was prescribed abx for this issue previously.  Could Dr Archie EndoSamtiago prescribe this for her? If not, pt will make an appt.  Please advise.   Pt:  [phone]

## 2018-09-27 LAB — HM MAMMOGRAPHY

## 2018-10-05 ENCOUNTER — Encounter: Payer: BLUE CROSS/BLUE SHIELD | Admitting: Family Medicine

## 2018-11-21 ENCOUNTER — Encounter: Payer: BC Managed Care – PPO | Admitting: Family Medicine

## 2018-11-27 ENCOUNTER — Other Ambulatory Visit: Payer: Self-pay | Admitting: Family Medicine

## 2018-11-27 DIAGNOSIS — L2082 Flexural eczema: Secondary | ICD-10-CM

## 2018-11-27 NOTE — Telephone Encounter (Signed)
Medication Refill - Medication: clobetasol ointment (TEMOVATE) 0.05 %   Has the patient contacted their pharmacy? Yes.   (Agent: If no, request that the patient contact the pharmacy for the refill.) (Agent: If yes, when and what did the pharmacy advise?)  Preferred Pharmacy (with phone number or street name):  [place], [place]  [address] [address]  Phone: [phone] Fax: [phone]  Not a 24 hour pharmacy; exact hours not known.     Agent: Please be advised that RX refills may take up to 3 business days. We ask that you follow-up with your pharmacy.

## 2018-11-27 NOTE — Telephone Encounter (Signed)
Requested medication (s) are due for refill today: no  Requested medication (s) are on the active medication list: yes  Last refill:  10/26/2017  Future visit scheduled: no  Notes to clinic:  Unable to refill per protocol. Tried to contact patient by phone and there was no answer. Vm was full. Please review   Requested Prescriptions  Pending Prescriptions Disp Refills   clobetasol ointment (TEMOVATE) 0.05 % 60 g 1    Sig: Apply topically 2 (two) times daily.     Dermatology:  Corticosteroids Failed - 11/27/2018 11:30 AM      Failed - Valid encounter within last 12 months    Recent Outpatient Visits          1 year ago Routine physical examination   Primary Care at [facility], Renette Butters, MD   1 year ago Essential hypertension, benign   [facility] at [facility], Lilia Argue, MD   2 years ago Bilateral edema of lower extremity   [facility] at [facility], Laurey Arrow, MD   2 years ago Essential hypertension, benign   Primary Care at [facility], Renette Butters, MD   2 years ago Essential hypertension, benign   Primary Care at [facility], Renette Butters, MD

## 2019-07-02 ENCOUNTER — Ambulatory Visit: Payer: Self-pay | Attending: Internal Medicine

## 2019-07-02 DIAGNOSIS — Z23 Encounter for immunization: Secondary | ICD-10-CM

## 2019-07-02 NOTE — Progress Notes (Signed)
° °  Covid-19 Vaccination Clinic  Name:  Etta Gassett    MRN: [id number] DOB: 10/31/1975  07/02/2019  Ms. Mckeel was observed post Covid-19 immunization for 15 minutes without incident. She was provided with Vaccine Information Sheet and instruction to access the V-Safe system.   Ms. Curl was instructed to call 911 with any severe reactions post vaccine:  Difficulty breathing   Swelling of face and throat   A fast heartbeat   A bad rash all over body   Dizziness and weakness   Immunizations Administered    Name Date Dose VIS Date Route   Pfizer COVID-19 Vaccine 07/02/2019 12:40 PM 0.3 mL 03/16/2019 Intramuscular   Manufacturer: [employer], [employer]   Lot: [id number]   NDC: [address]-3

## 2019-07-25 ENCOUNTER — Ambulatory Visit: Payer: Self-pay

## 2019-10-03 LAB — HM MAMMOGRAPHY
